# Patient Record
Sex: Female | Born: 1990 | Race: White | Hispanic: No | Marital: Single
Health system: Western US, Academic
[De-identification: ages and names within clinical notes are randomized; demographics above are authoritative.]

---
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
## 2010-08-14 ENCOUNTER — Ambulatory Visit (HOSPITAL_COMMUNITY)
Admission: RE | Admit: 2010-08-14 | Discharge: 2010-08-14 | Payer: Self-pay | Source: Home / Self Care | Attending: Internal Medicine | Admitting: Internal Medicine

## 2010-12-04 NOTE — Discharge Summary (Signed)
NAME:  Betty Robles, Betty Robles NO.:  [id number]   MEDICAL RECORD NO.:  [id number]          PATIENT TYPE:  INP   LOCATION:  6154                         FACILITY:  [facility]   PHYSICIAN:  Pediatrics Resident    DATE OF BIRTH:  1991/04/01   DATE OF ADMISSION:  11/16/2006  DATE OF DISCHARGE:                               DISCHARGE SUMMARY   REASON FOR HOSPITALIZATION:  Ethanol intoxication.   SIGNIFICANT FINDINGS:  The patient is a 20 year old who was found  unresponsive at 2:00 a.m. at home and brought to the ED by EMS.  Due to  the patient's unresponsiveness she was actually intubated out in the  field before she was brought into the ED.  She had a head CT here that  was negative and an ethanol level was drawn which was found to be 390  initially and therefore it was thought that her unresponsiveness was due  to ethanol intoxication.  Her ethanol levels decreased progressively  throughout the hospital stay down to 270 at the time of discharge.  She  awoke the morning of her hospitalization which was approximately 10  hours after admission and was extubated at that time.  She came off of 2  liters of oxygen very quickly and was maintaining her sats in the high  90s to 100% for greater than 4 hours at the time of discharge.  A  psychiatrist saw the patient who felt like she did have some elements of  depression; however, did not feel that this was a suicide attempt and  the patient was safe to go home.  The patient expressed interest in  seeing the psychiatrist as an outpatient.  The patient was tolerating  p.o. and was stable at the time of discharge.   OPERATIONS/PROCEDURES:  Intubated.  Chest x-ray shows no acute process.   FINAL DIAGNOSES:  1. Ethanol intoxication.  2. Possible depression.   DISCHARGE INSTRUCTIONS:  Please avoid all alcohol.  Please report to the PCP or the ER immediately if there is any change in  mental status or if she has any thoughts of hurting  herself or anyone  else.   FOLLOWUP:  The patient is to followup with Dr. Theresia Lo of [facility] on Nov 18, 2006, at 10:00 a.m. and to followup with Dr. Lindie Spruce of  psychology on Nov 22, 2006.   DISCHARGE CONDITION:  Fair.   This was faxed to primary physician, Dr. Theresia Lo on November 16, 2006.           ______________________________  Pediatrics Resident     PR/MEDQ  D:  11/16/2006  T:  11/16/2006  Job:  [id number]

## 2010-12-04 NOTE — Discharge Summary (Signed)
NAME:  Betty Robles, Betty Robles NO.:  [id number]   MEDICAL RECORD NO.:  [id number]          PATIENT TYPE:  INP   LOCATION:  6153                         FACILITY:  [facility]   PHYSICIAN:  Pediatrics Resident    DATE OF BIRTH:  1990-11-18   DATE OF ADMISSION:  11/15/2006  DATE OF DISCHARGE:  11/17/2006                               DISCHARGE SUMMARY   ADDENDUM:  The patient was kept overnight because of inability to take p.o. fluids  and was watched.  She had a repeat blood alcohol level checked in the  morning; it was found to be less than 5.  She was doing well clinically  and was then discharged from the floor to home with followup on Friday,  Nov 18, 2006.           ______________________________  Pediatrics Resident     PR/MEDQ  D:  11/17/2006  T:  11/17/2006  Job:  [id number]

## 2011-09-30 IMAGING — US US TRANSVAGINAL NON-OB
1 series · 13 of 25 positions shown · non-contrast
Comparison: None.

CLINICAL DATA: Pelvic pain for 1 month with spotting between
cycles.  LMP 07/20/2010



[Series 1: us pelvis complete modify · 13 of 36 slices shown]
[im 1/36]
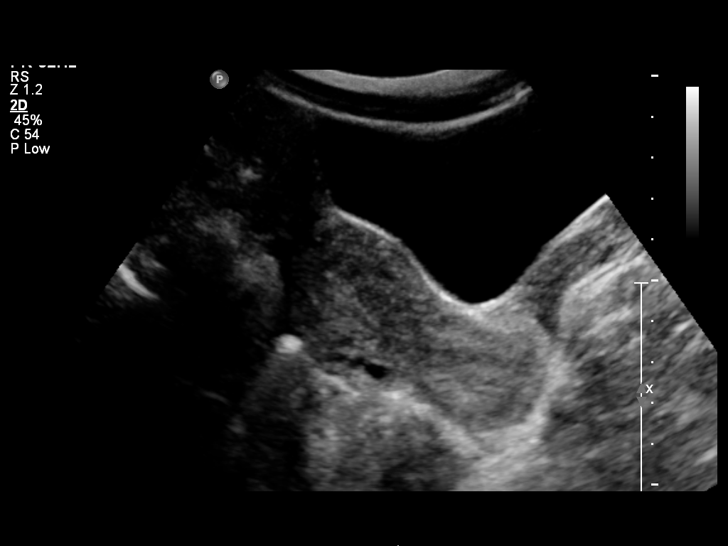
[im 3/36]
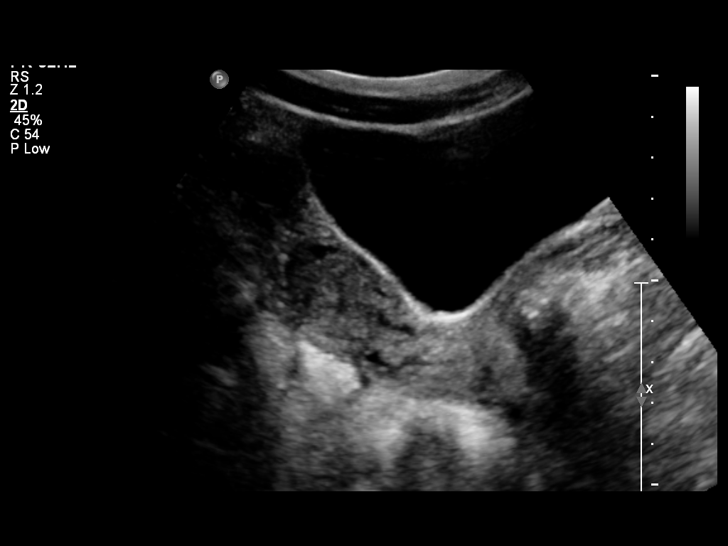
[im 6/36]
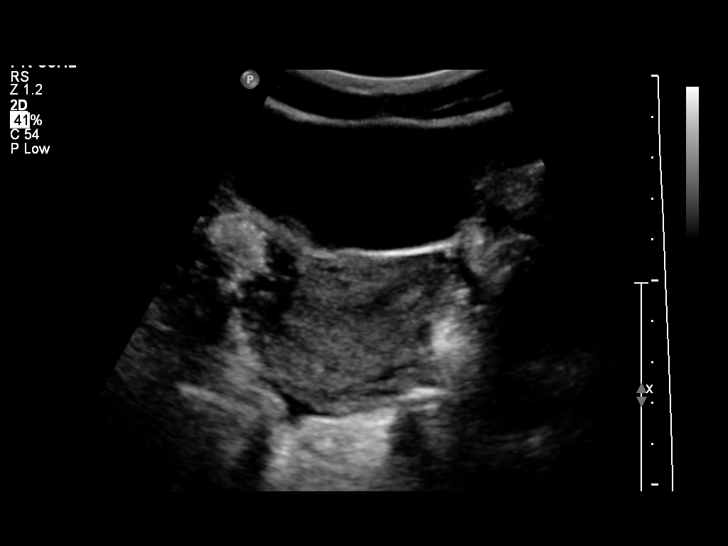
[im 9/36]
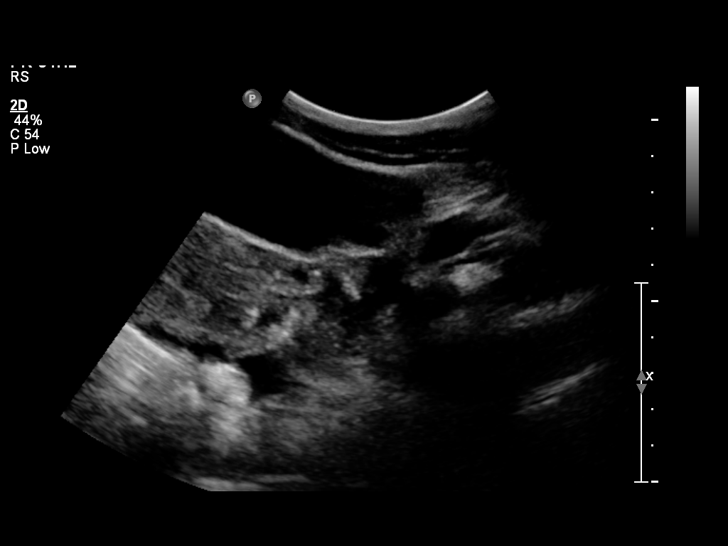
[im 12/36]
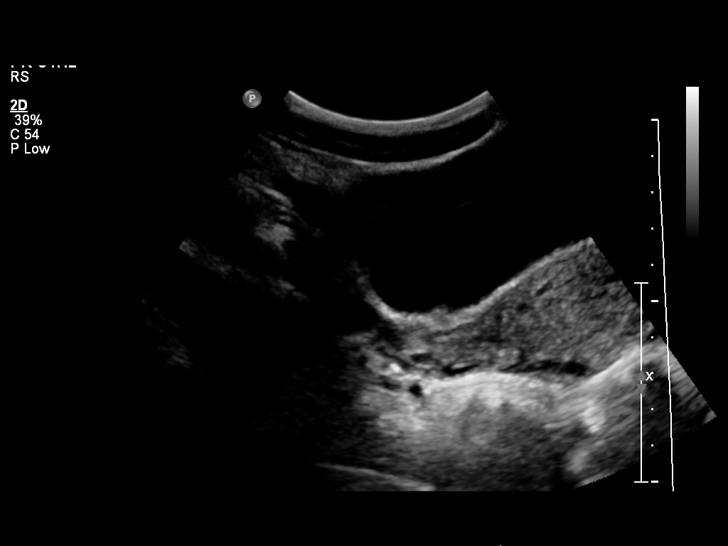
[im 15/36]
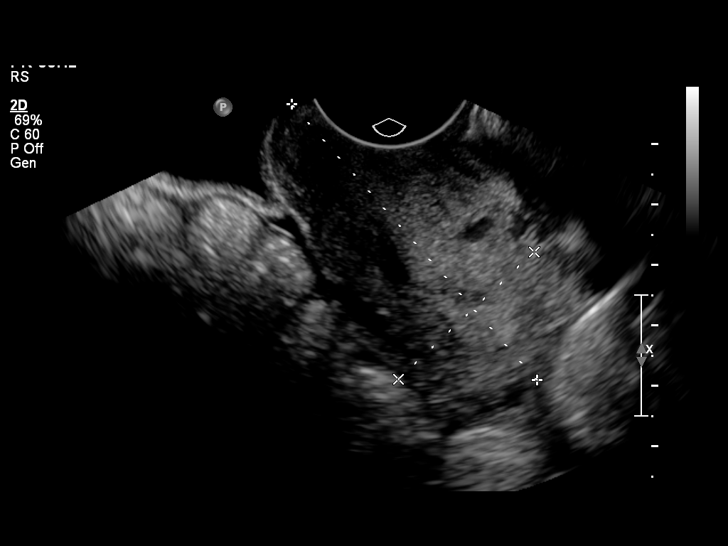
[im 18/36]
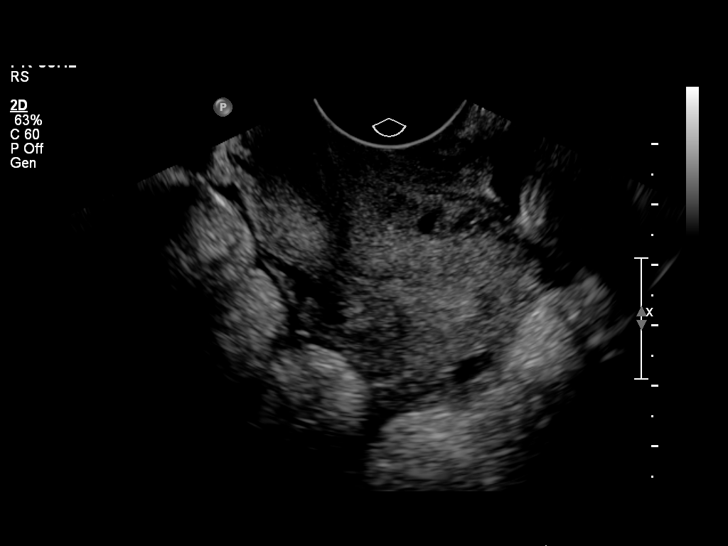
[im 21/36]
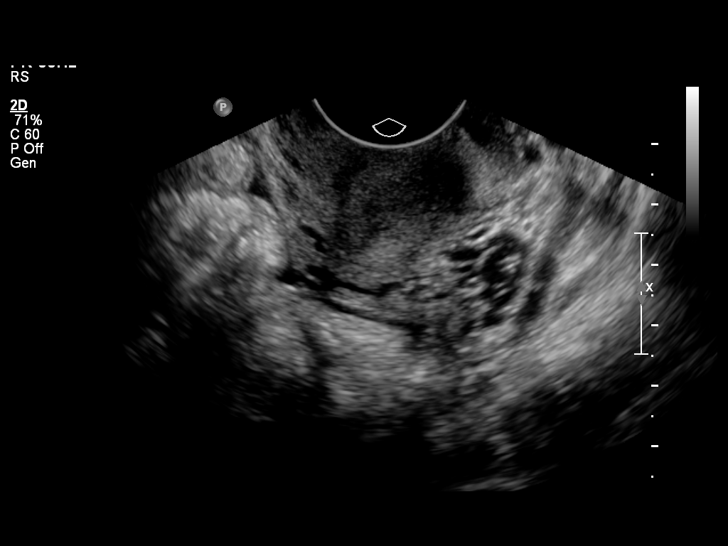
[im 24/36]
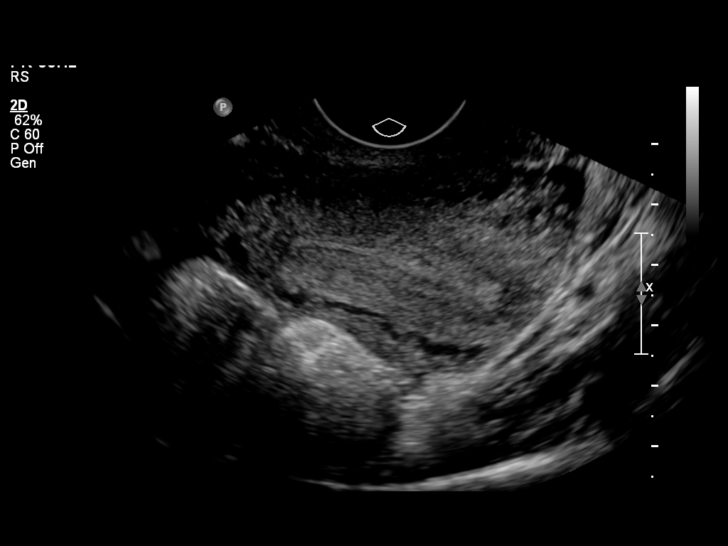
[im 27/36]
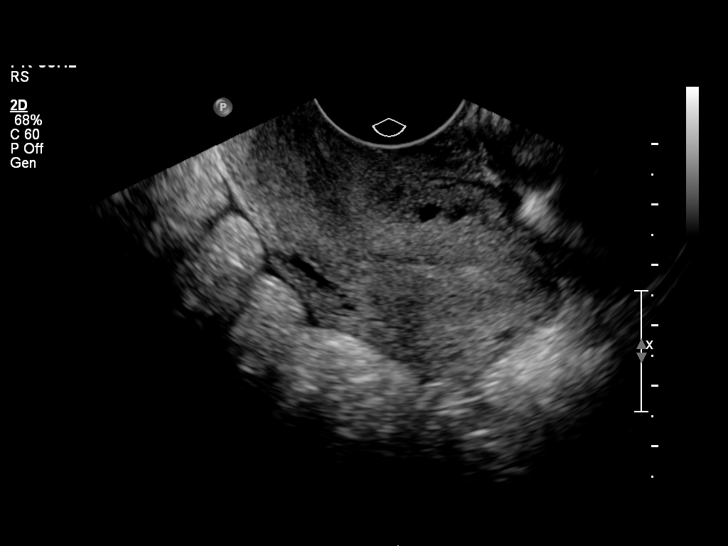
[im 30/36]
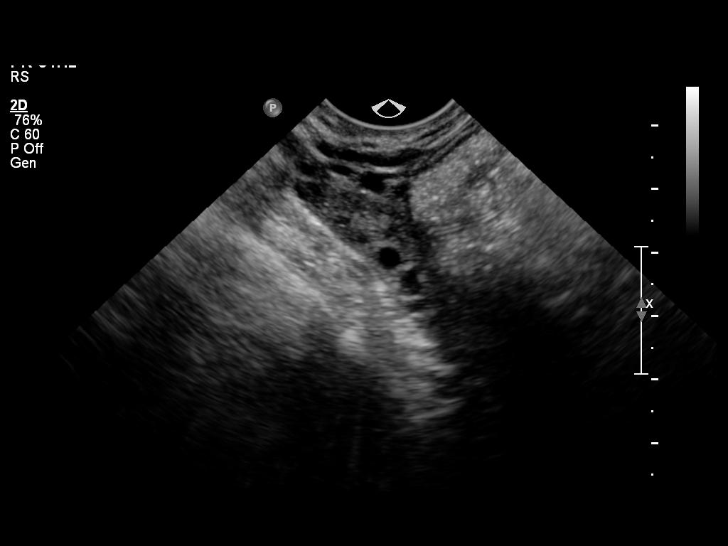
[im 33/36]
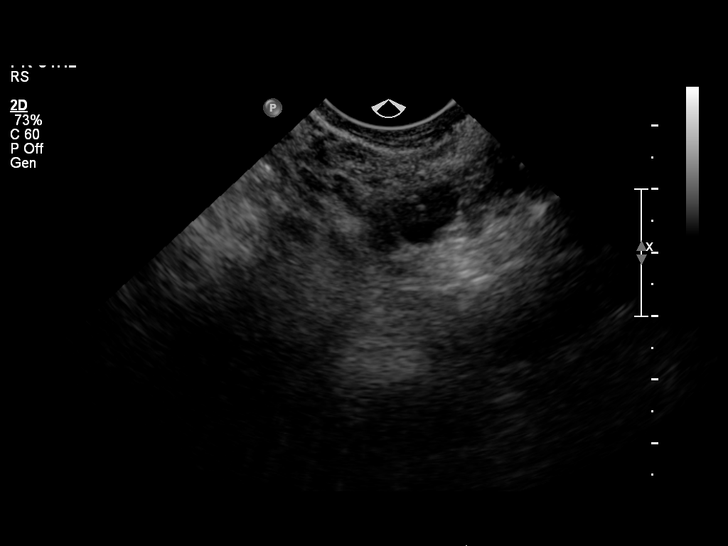
[im 36/36]
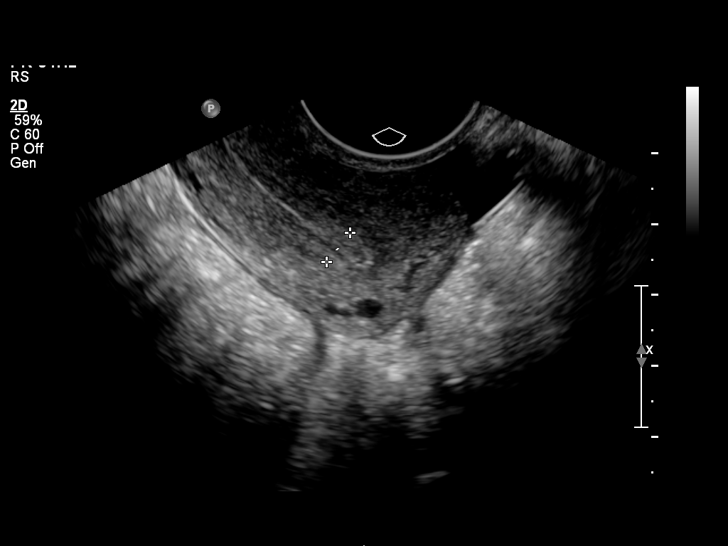

[13 of 25 positions shown; findings below may reference images not displayed]

FINDINGS: Uterus the uterus demonstrates a sagittal length of 6.1 cm, an AP
depth of 3.1 cm and a transverse width of 5.2 cm.  A homogeneous
uterine myometrium is seen.

Endometrium is homogeneously echogenic with an AP width of 5.5 mm.
No areas of focal thickening or heterogeneity are seen and this
would correlate with a presecretory endometrial stripe and
correspond with the patient's given LMP of 07/20/2010

Right Ovary has a normal appearance measuring 1.4 x 3.27 x 1.4 cm

Left Ovary is not seen with confidence either transabdominally or
endovaginally

Other Findings:  A large amount of peristalsing bowel was noted at
real time exam and shadowing from bowel gas is noted in the left
lower quadrant suggesting the possibility of stool within the
sigmoid colon.  A small  amount of simple free fluid is noted in
the cul-de-sac.  No separate adnexal masses are noted.
IMPRESSION: Normal presecretory uterine myometrium, endometrium and right
ovary.  Non-visualized left ovary.

The large amount of peristalsing bowel was present at real time
exam as well as the suggestion of stool within the sigmoid colon.

## 2011-10-11 ENCOUNTER — Other Ambulatory Visit: Payer: Self-pay | Admitting: Family Medicine

## 2011-10-11 MED ORDER — LEVONORGESTREL-ETHINYL ESTRAD 0.1-20 MG-MCG PO TABS: 1.0000 | ORAL_TABLET | Freq: Every day | ORAL | Status: DC

## 2011-11-03 ENCOUNTER — Encounter: Payer: Self-pay | Admitting: Internal Medicine

## 2011-11-03 ENCOUNTER — Ambulatory Visit (INDEPENDENT_AMBULATORY_CARE_PROVIDER_SITE_OTHER): Payer: BC Managed Care – PPO | Admitting: Internal Medicine

## 2011-11-03 VITALS — BP 135/93 | HR 81 | Temp 98.1°F | Resp 16 | Ht 66.5 in | Wt 130.6 lb

## 2011-11-03 DIAGNOSIS — R000 Tachycardia, unspecified: Secondary | ICD-10-CM

## 2011-11-03 DIAGNOSIS — Z309 Encounter for contraceptive management, unspecified: Secondary | ICD-10-CM

## 2011-11-03 MED ORDER — LEVONORGESTREL-ETHINYL ESTRAD 0.1-20 MG-MCG PO TABS: 1.0000 | ORAL_TABLET | Freq: Every day | ORAL | Status: DC

## 2011-11-04 ENCOUNTER — Encounter: Payer: Self-pay | Admitting: Internal Medicine

## 2011-11-04 NOTE — Progress Notes (Signed)
  Subjective:    Patient ID: Betty Robles, female    DOB: Apr 26, 1991, 20 y.o.   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  Active gym working out regularly/has one question about whether her heart rate is okay-at times she reaches 180 while on the treadmill or elliptical trainer/she has no associated symptoms of chest pain or shortness of breath that is excessive and no post performance problems with lightheadedness or syncope  Review of SystemsNoncontributory Including cardiovascular respiratory or neurological symptoms     Objective:   Physical ExamBlood pressure 130/80 repeated No thyromegaly Heart Regular without murmur rub click or gallop rate is 65  lungs clear Neurological intact        Assessment & Plan:  Contraceptive care Refill oral contraceptive x1 year Schedule Pap smear in one year  Tachycardia with exercise Borderline  blood pressures  with office visits by history

## 2012-10-09 ENCOUNTER — Other Ambulatory Visit: Payer: Self-pay | Admitting: Internal Medicine

## 2012-11-13 ENCOUNTER — Other Ambulatory Visit: Payer: Self-pay | Admitting: Internal Medicine

## 2012-11-22 ENCOUNTER — Ambulatory Visit (INDEPENDENT_AMBULATORY_CARE_PROVIDER_SITE_OTHER): Payer: BC Managed Care – PPO | Admitting: Internal Medicine

## 2012-11-22 ENCOUNTER — Encounter: Payer: Self-pay | Admitting: Internal Medicine

## 2012-11-22 VITALS — BP 116/70 | HR 74 | Temp 99.4°F | Resp 16 | Ht 67.0 in | Wt 129.6 lb

## 2012-11-22 DIAGNOSIS — Z23 Encounter for immunization: Secondary | ICD-10-CM

## 2012-11-22 DIAGNOSIS — Z0000 Encounter for general adult medical examination without abnormal findings: Secondary | ICD-10-CM

## 2012-11-22 MED ORDER — LEVONORGESTREL-ETHINYL ESTRAD 0.1-20 MG-MCG PO TABS: ORAL_TABLET | ORAL | Status: DC

## 2012-11-22 MED ORDER — TETANUS-DIPHTH-ACELL PERTUSSIS 5-2.5-18.5 LF-MCG/0.5 IM SUSP: 0.5000 mL | Freq: Once | INTRAMUSCULAR | Status: DC

## 2012-11-22 NOTE — Progress Notes (Signed)
  Subjective:    Patient ID: Betty Robles, female    DOB: 1990/07/29, 22 y.o.   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

## 2012-11-22 NOTE — Progress Notes (Addendum)
  Subjective:    Patient ID: Betty Robles, female    DOB: 1991/05/22, 22 y.o.   MRN: [id number]  HPI    Review of Systems  Constitutional: Negative.   HENT: Negative.   Eyes: Negative.   Respiratory: Negative.   Cardiovascular: Negative.   Gastrointestinal: Negative.   Endocrine: Negative.   Genitourinary: Negative.   Musculoskeletal: Negative.   Skin: Negative.   Allergic/Immunologic: Negative.   Neurological: Negative.   Hematological: Negative.   Psychiatric/Behavioral: Negative.        Objective:   Physical Exam        Assessment & Plan:  Rev RPD

## 2012-11-23 LAB — PAP IG, CT-NG, RFX HPV ASCU: Chlamydia Probe Amp: NEGATIVE

## 2012-11-27 ENCOUNTER — Encounter: Payer: Self-pay | Admitting: Internal Medicine

## 2013-11-28 ENCOUNTER — Ambulatory Visit (INDEPENDENT_AMBULATORY_CARE_PROVIDER_SITE_OTHER): Payer: BC Managed Care – PPO | Admitting: Physician Assistant

## 2013-11-28 VITALS — BP 122/82 | HR 78 | Temp 98.8°F | Resp 16 | Ht 67.0 in | Wt 128.0 lb

## 2013-11-28 DIAGNOSIS — Z309 Encounter for contraceptive management, unspecified: Secondary | ICD-10-CM

## 2013-11-28 DIAGNOSIS — Z113 Encounter for screening for infections with a predominantly sexual mode of transmission: Secondary | ICD-10-CM

## 2013-11-28 DIAGNOSIS — Z0000 Encounter for general adult medical examination without abnormal findings: Secondary | ICD-10-CM

## 2013-11-28 LAB — POCT CBC
Granulocyte percent: 62.4 %G (ref 37–80)
HEMATOCRIT: 42.1 % (ref 37.7–47.9)
Hemoglobin: 13.6 g/dL (ref 12.2–16.2)
LYMPH, POC: 2.6 (ref 0.6–3.4)
MCH: 30.4 pg (ref 27–31.2)
MCHC: 32.3 g/dL (ref 31.8–35.4)
MCV: 94.1 fL (ref 80–97)
MID (cbc): 0.5 (ref 0–0.9)
MPV: 9.1 fL (ref 0–99.8)
PLATELET COUNT, POC: 329 10*3/uL (ref 142–424)
POC Granulocyte: 5.2 (ref 2–6.9)
POC LYMPH %: 31.3 % (ref 10–50)
POC MID %: 6.3 %M (ref 0–12)
RBC: 4.47 M/uL (ref 4.04–5.48)
RDW, POC: 12.8 %
WBC: 8.4 10*3/uL (ref 4.6–10.2)

## 2013-11-28 MED ORDER — LEVONORGESTREL-ETHINYL ESTRAD 0.1-20 MG-MCG PO TABS: ORAL_TABLET | ORAL | Status: DC

## 2013-11-28 NOTE — Progress Notes (Signed)
Subjective:    Patient ID: Betty AmourElina Robles, female    DOB: 05/31/1991, 23 y.o.   MRN: [id number]  HPI   Betty Robles is a very pleasant 23 yr old female here for CPE.  Complaints:  Refill on OCP, STD screening LMP:  Thinks 11/11/13, regular every month Not currently sexually active; estimates condoms 60% of the time Contraception:  OCPs GYN:  Normal pap 2014 Dentist:  Not regularly - 2-3 yrs ago Eye doctor:  Glasses at home; last eye appt 1.5 yrs ago Imm:  Utd, including gardasil Diet:  Varied, does eat junk food but tries to moderate; mostly water and coffee to drink Exercise:  Not really Meds:  Only ocp Tobacco:  Former smoker Etoh:  3-5 drinks per week  Work:  Forensic psychologistBartender/server     Review of Systems  Constitutional: Negative.   HENT: Negative.   Eyes: Negative.   Respiratory: Negative.   Cardiovascular: Negative.   Gastrointestinal: Negative.   Genitourinary: Negative.   Musculoskeletal: Negative.   Skin: Negative.        Objective:   Physical Exam  Vitals reviewed. Constitutional: She is oriented to person, place, and time. She appears well-developed and well-nourished. No distress.  HENT:  Head: Normocephalic and atraumatic.  Right Ear: Tympanic membrane and ear canal normal.  Left Ear: Ear canal normal.  Mouth/Throat: Uvula is midline, oropharynx is clear and moist and mucous membranes are normal.  Eyes: Conjunctivae and EOM are normal. Pupils are equal, round, and reactive to light. No scleral icterus.  Neck: Normal range of motion. Neck supple. No thyromegaly present.  Cardiovascular: Normal rate, regular rhythm, normal heart sounds and intact distal pulses.   Pulmonary/Chest: Effort normal and breath sounds normal. She has no wheezes. She has no rales.  Abdominal: Soft. Bowel sounds are normal. There is no tenderness.  Genitourinary:  Deferred as no complaints and not due for pap  Lymphadenopathy:    She has no cervical adenopathy.  Neurological:  She is alert and oriented to person, place, and time. She has normal reflexes.  Skin: Skin is warm and dry.  Psychiatric: She has a normal mood and affect. Her behavior is normal.   Self collected gen probe   Results for orders placed in visit on 11/28/13  COMPREHENSIVE METABOLIC PANEL      Result Value Ref Range   Sodium 136  135 - 145 mEq/L   Potassium 4.9  3.5 - 5.3 mEq/L   Chloride 103  96 - 112 mEq/L   CO2 25  19 - 32 mEq/L   Glucose, Bld 89  70 - 99 mg/dL   BUN 12  6 - 23 mg/dL   Creat 4.130.77  2.440.50 - 0.101.10 mg/dL   Total Bilirubin 0.4  0.2 - 1.2 mg/dL   Alkaline Phosphatase 46  39 - 117 U/L   AST 14  0 - 37 U/L   ALT 14  0 - 35 U/L   Total Protein 8.0  6.0 - 8.3 g/dL   Albumin 4.9  3.5 - 5.2 g/dL   Calcium 9.6  8.4 - 27.210.5 mg/dL  HIV ANTIBODY (ROUTINE TESTING)      Result Value Ref Range   HIV 1&2 Ab, 4th Generation NONREACTIVE  NONREACTIVE  TSH      Result Value Ref Range   TSH 1.159  0.350 - 4.500 uIU/mL  RPR      Result Value Ref Range   RPR NON REAC  NON REAC  LIPID PANEL  Result Value Ref Range   Cholesterol 198  0 - 200 mg/dL   Triglycerides 161153 (*) <150 mg/dL   HDL 58  >09>39 mg/dL   Total CHOL/HDL Ratio 3.4     VLDL 31  0 - 40 mg/dL   LDL Cholesterol 604109 (*) 0 - 99 mg/dL  POCT CBC      Result Value Ref Range   WBC 8.4  4.6 - 10.2 K/uL   Lymph, poc 2.6  0.6 - 3.4   POC LYMPH PERCENT 31.3  10 - 50 %L   MID (cbc) 0.5  0 - 0.9   POC MID % 6.3  0 - 12 %M   POC Granulocyte 5.2  2 - 6.9   Granulocyte percent 62.4  37 - 80 %G   RBC 4.47  4.04 - 5.48 M/uL   Hemoglobin 13.6  12.2 - 16.2 g/dL   HCT, POC 54.042.1  98.137.7 - 47.9 %   MCV 94.1  80 - 97 fL   MCH, POC 30.4  27 - 31.2 pg   MCHC 32.3  31.8 - 35.4 g/dL   RDW, POC 19.112.8     Platelet Count, POC 329  142 - 424 K/uL   MPV 9.1  0 - 99.8 fL        Assessment & Plan:  Routine general medical examination at a health care facility - Plan: POCT CBC, Comprehensive metabolic panel, HIV antibody, TSH, RPR,  GC/Chlamydia Probe Amp, Lipid panel  Screen for STD (sexually transmitted disease) - Plan: HIV antibody, RPR, GC/Chlamydia Probe Amp  Contraception management - Plan: levonorgestrel-ethinyl estradiol (ORSYTHIA) 0.1-20 MG-MCG tablet   Betty Robles is a very pleasant 23 yr old female here for CPE.  She appears to be in good health and exam is normal.  Routine labs drawn - all WNL.  GC/chlamydia, HIV, RPR all negative.  Utd on imm.  OCP refilled.  Due for pap in 2017    E. Frances FurbishElizabeth Seva Robles MHS, PA-C [facility] 5/14/20156:52 PM

## 2013-11-28 NOTE — Patient Instructions (Signed)

## 2013-11-29 LAB — COMPREHENSIVE METABOLIC PANEL
ALBUMIN: 4.9 g/dL (ref 3.5–5.2)
ALK PHOS: 46 U/L (ref 39–117)
ALT: 14 U/L (ref 0–35)
AST: 14 U/L (ref 0–37)
BILIRUBIN TOTAL: 0.4 mg/dL (ref 0.2–1.2)
BUN: 12 mg/dL (ref 6–23)
CHLORIDE: 103 meq/L (ref 96–112)
CO2: 25 mEq/L (ref 19–32)
Calcium: 9.6 mg/dL (ref 8.4–10.5)
Creat: 0.77 mg/dL (ref 0.50–1.10)
Glucose, Bld: 89 mg/dL (ref 70–99)
POTASSIUM: 4.9 meq/L (ref 3.5–5.3)
SODIUM: 136 meq/L (ref 135–145)
Total Protein: 8 g/dL (ref 6.0–8.3)

## 2013-11-29 LAB — LIPID PANEL
Cholesterol: 198 mg/dL (ref 0–200)
HDL: 58 mg/dL (ref >39)
LDL Cholesterol: 109 mg/dL — ABNORMAL HIGH (ref 0–99)
Total CHOL/HDL Ratio: 3.4 Ratio
Triglycerides: 153 mg/dL — ABNORMAL HIGH (ref <150)
VLDL: 31 mg/dL (ref 0–40)

## 2013-11-29 LAB — TSH: TSH: 1.159 u[IU]/mL (ref 0.350–4.500)

## 2013-11-29 LAB — RPR

## 2013-11-29 LAB — HIV ANTIBODY (ROUTINE TESTING W REFLEX): HIV 1&2 Ab, 4th Generation: NONREACTIVE

## 2013-11-30 LAB — GC/CHLAMYDIA PROBE AMP
CT PROBE, AMP APTIMA: NEGATIVE
GC PROBE AMP APTIMA: NEGATIVE

## 2013-12-07 ENCOUNTER — Other Ambulatory Visit: Payer: Self-pay | Admitting: Internal Medicine

## 2013-12-21 ENCOUNTER — Encounter: Payer: BC Managed Care – PPO | Admitting: Family Medicine

## 2014-10-13 ENCOUNTER — Ambulatory Visit (INDEPENDENT_AMBULATORY_CARE_PROVIDER_SITE_OTHER): Payer: BLUE CROSS/BLUE SHIELD | Admitting: Physician Assistant

## 2014-10-13 VITALS — BP 122/74 | HR 97 | Temp 98.7°F | Resp 16 | Ht 67.0 in | Wt 130.4 lb

## 2014-10-13 DIAGNOSIS — R058 Other specified cough: Secondary | ICD-10-CM

## 2014-10-13 DIAGNOSIS — R05 Cough: Secondary | ICD-10-CM

## 2014-10-13 MED ORDER — HYDROCOD POLST-CHLORPHEN POLST 10-8 MG/5ML PO LQCR: 5.0000 mL | Freq: Two times a day (BID) | ORAL | Status: DC | PRN

## 2014-10-13 NOTE — Progress Notes (Signed)
   Subjective:    Patient ID: Betty Robles, female    DOB: 04/01/1991, 24 y.o.   MRN: [id number]  HPI Pt presents to clinic with dry cough for the last 4 days.  About 10 days ago she got a cold with congestion and cough that was productive but that has all resolved and now she has this cough that seems to be coming from her throat - increased cough with deep breaths, talking and laying down.  She has tried OTC meds but none are helping.  She is otherwise healthy and only on OCPs.  OTC meds - delsym  Review of Systems  Constitutional: Negative for fever and chills.  HENT: Negative for congestion, postnasal drip, rhinorrhea and sore throat.   Respiratory: Positive for cough (dry).   Gastrointestinal: Negative.   Neurological: Negative for headaches.  Psychiatric/Behavioral: Positive for sleep disturbance (2nd to cough).       Objective:   Physical Exam  Constitutional: She is oriented to person, place, and time. She appears well-developed and well-nourished.  BP 122/74 mmHg  Pulse 97  Temp(Src) 98.7 F (37.1 C) (Oral)  Resp 16  Ht 5\' 7"  (1.702 m)  Wt 130 lb 6.4 oz (59.149 kg)  BMI 20.42 kg/m2  SpO2 98%  LMP 10/13/2014   HENT:  Head: Normocephalic and atraumatic.  Right Ear: External ear normal.  Left Ear: External ear normal.  Eyes: Conjunctivae are normal.  Neck: Normal range of motion.  Cardiovascular: Normal rate, regular rhythm and normal heart sounds.   No murmur heard. Pulmonary/Chest: Effort normal and breath sounds normal. She has no wheezes.  Lymphadenopathy:    She has no cervical adenopathy.  Neurological: She is alert and oriented to person, place, and time.  Skin: Skin is warm and dry.  Psychiatric: She has a normal mood and affect. Her behavior is normal. Judgment and thought content normal.       Assessment & Plan:  Post-viral cough syndrome - Plan: chlorpheniramine-HYDROcodone (TUSSIONEX PENNKINETIC ER) 10-8 MG/5ML LQCR   Symptomatic  care.  Benny LennertSarah Weber PA-C  [facility] 10/13/2014 8:52 AM

## 2014-10-18 ENCOUNTER — Telehealth: Payer: Self-pay

## 2014-10-18 DIAGNOSIS — R05 Cough: Secondary | ICD-10-CM

## 2014-10-18 DIAGNOSIS — R058 Other specified cough: Secondary | ICD-10-CM

## 2014-10-18 NOTE — Telephone Encounter (Signed)
Pt was prescribed tussionex on 10/13/14 and was told to call back if the symptoms persisted. She is requesting a refill of the rx because her cough came back after a day at work where she had to talk loudly.

## 2014-10-21 MED ORDER — HYDROCOD POLST-CHLORPHEN POLST 10-8 MG/5ML PO LQCR: 5.0000 mL | Freq: Two times a day (BID) | ORAL | Status: AC | PRN

## 2014-10-21 NOTE — Telephone Encounter (Signed)
Assessment & Plan:  Post-viral cough syndrome - Plan: chlorpheniramine-HYDROcodone (TUSSIONEX PENNKINETIC ER) 10-8 MG/5ML LQCR   Symptomatic care.  Benny LennertSarah Weber PA-C  [facility] 10/13/2014 8:52 AM

## 2014-10-21 NOTE — Telephone Encounter (Signed)
We have one [facility] of this [facility]. She will need to come in to be evaluated for any further refills.

## 2014-10-21 NOTE — Telephone Encounter (Signed)
lmom that rx is ready for p/u.

## 2014-11-29 ENCOUNTER — Other Ambulatory Visit: Payer: Self-pay

## 2014-11-29 DIAGNOSIS — Z308 Encounter for other contraceptive management: Secondary | ICD-10-CM

## 2014-11-29 MED ORDER — LEVONORGESTREL-ETHINYL ESTRAD 0.1-20 MG-MCG PO TABS: ORAL_TABLET | ORAL | Status: DC

## 2014-12-11 ENCOUNTER — Other Ambulatory Visit: Payer: Self-pay | Admitting: Physician Assistant

## 2015-01-31 ENCOUNTER — Other Ambulatory Visit: Payer: Self-pay | Admitting: Physician Assistant

## 2015-02-03 NOTE — Telephone Encounter (Signed)
Pt hasn't been seen for contraception management in over a year. LMOM to CB w/f/up plan.

## 2020-09-04 ENCOUNTER — Inpatient Hospital Stay
Admission: EM | Admit: 2020-09-04 | Discharge: 2020-09-05 | DRG: 897 | Disposition: A | Discharge status: Home / Self Care | Payer: 59 | Attending: Internal Medicine | Admitting: Internal Medicine

## 2020-09-04 ENCOUNTER — Emergency Department: Payer: 59

## 2020-09-04 DIAGNOSIS — F10139 Alcohol abuse with withdrawal, unspecified (CMS-HCC): Secondary | ICD-10-CM | POA: Diagnosis present

## 2020-09-04 DIAGNOSIS — Z789 Other specified health status: Secondary | ICD-10-CM

## 2020-09-04 DIAGNOSIS — F10939 Alcohol use, unspecified with withdrawal, unspecified (CMS-HCC): Secondary | ICD-10-CM

## 2020-09-04 DIAGNOSIS — F10239 Alcohol dependence with withdrawal, unspecified: Secondary | ICD-10-CM

## 2020-09-04 DIAGNOSIS — G9340 Encephalopathy, unspecified: Secondary | ICD-10-CM | POA: Diagnosis present

## 2020-09-04 DIAGNOSIS — N390 Urinary tract infection, site not specified: Secondary | ICD-10-CM

## 2020-09-04 DIAGNOSIS — Z20822 Contact with and (suspected) exposure to covid-19: Secondary | ICD-10-CM | POA: Diagnosis present

## 2020-09-04 DIAGNOSIS — R9431 Abnormal electrocardiogram [ECG] [EKG]: Secondary | ICD-10-CM

## 2020-09-04 DIAGNOSIS — R8271 Bacteriuria: Secondary | ICD-10-CM | POA: Diagnosis present

## 2020-09-04 DIAGNOSIS — R269 Unspecified abnormalities of gait and mobility: Secondary | ICD-10-CM

## 2020-09-04 DIAGNOSIS — F17200 Nicotine dependence, unspecified, uncomplicated: Secondary | ICD-10-CM | POA: Diagnosis present

## 2020-09-04 DIAGNOSIS — F10151 Alcohol abuse with alcohol-induced psychotic disorder with hallucinations: Principal | ICD-10-CM | POA: Diagnosis present

## 2020-09-04 DIAGNOSIS — R4182 Altered mental status, unspecified: Secondary | ICD-10-CM

## 2020-09-04 LAB — COMPREHENSIVE METABOLIC PANEL, BLOOD
ALT: 27 U/L (ref 7–52)
AST: 50 U/L — ABNORMAL HIGH (ref 13–39)
Albumin: 4.1 G/DL (ref 3.7–5.3)
Alk Phos: 80 U/L (ref 34–104)
BUN: 11 mg/dL (ref 7–25)
Bilirubin, Total: 1.3 mg/dL (ref 0.0–1.4)
CO2: 26 mmol/L (ref 21–31)
Calcium: 8.5 mg/dL — ABNORMAL LOW (ref 8.6–10.3)
Chloride: 98 mmol/L (ref 98–107)
Creat: 0.5 mg/dL — ABNORMAL LOW (ref 0.6–1.2)
Electrolyte Balance: 9 mmol/L (ref 2–12)
Glucose: 101 mg/dL (ref 70–115)
Potassium: 3.6 mmol/L (ref 3.5–5.1)
Protein, Total: 7.2 G/DL (ref 6.0–8.3)
Sodium: 133 mmol/L — ABNORMAL LOW (ref 136–145)
eGFR - high estimate: >60 (ref >59)
eGFR - low estimate: >60 (ref >59)

## 2020-09-04 LAB — HCG QUANTITATIVE, BLOOD: Beta hCG: <1 m[IU]/mL

## 2020-09-04 LAB — VENOUS BLOOD GAS AND LYTES
Base Excess: 2.6 mmol/L
Calcium, Ionized: 1.09 mmol/L — ABNORMAL LOW (ref 1.13–1.32)
Carboxy Hemoglobin: 1.1 % (ref 0.5–1.5)
Chloride (with BG): 97 mmol/L — ABNORMAL LOW (ref 99–111)
Glucose (with BG): 110 MG/DL (ref 70–110)
HCO3: 27.6 mmol/L — ABNORMAL HIGH (ref 21.0–27.0)
Hematocrit (with BG): 41 % (ref 38–46)
Inspired O2: 0.21
Methemoglobin: 0.3 % (ref 0.0–1.5)
O2 Content: 10.8 ML/DL — ABNORMAL LOW (ref 16.0–21.5)
O2 Saturation: 58.5 %
PCO2, Venous: 44.2 MMHG (ref 42.0–48.0)
PO2, Venous: 29.8 MMHG — ABNORMAL LOW (ref 30.0–49.0)
Potassium, (with BG): 3.8 mmol/L (ref 3.7–5.5)
Sodium (with BG): 136 mmol/L — ABNORMAL LOW (ref 138–146)
Total CO2: 29 mmol/L (ref 23–29)
pH, Venous: 7.41 — ABNORMAL HIGH (ref 7.36–7.40)

## 2020-09-04 LAB — SALICYLATES, SERUM: Salicylates, Bld: <1 MG/DL — ABNORMAL LOW (ref 6–30)

## 2020-09-04 LAB — CBC WITH DIFF, BLOOD
ANC automated: 5.9 10*3/uL (ref 2.0–8.1)
Basophils %: 0.4 %
Basophils Absolute: 0 10*3/uL (ref 0.0–0.2)
Eosinophils %: 1.5 %
Eosinophils Absolute: 0.1 10*3/uL (ref 0.0–0.5)
Hematocrit: 36.3 % (ref 34.0–44.0)
Hgb: 12.5 G/DL (ref 11.5–15.0)
Lymphocytes %: 25.3 %
Lymphocytes Absolute: 2.3 10*3/uL (ref 0.9–3.3)
MCH: 32.4 PG (ref 27.0–33.5)
MCHC: 34.3 G/DL (ref 32.0–35.5)
MCV: 94.3 FL (ref 81.5–97.0)
MPV: 8.4 FL (ref 7.2–11.7)
Monocytes %: 8.4 %
Monocytes Absolute: 0.8 10*3/uL (ref 0.0–0.8)
Neutrophils % (A): 64.4 %
PLT Count: 120 10*3/uL — ABNORMAL LOW (ref 150–400)
RBC: 3.85 10*6/uL (ref 3.70–5.00)
RDW-CV: 14.6 % — ABNORMAL HIGH (ref 11.6–14.4)
White Bld Cell Count: 9.1 10*3/uL (ref 4.0–10.5)

## 2020-09-04 LAB — LACTATE, BLOOD: Lactic Acid: 1.3 mmol/L (ref 0.5–2.0)

## 2020-09-04 LAB — COVID-19, FLU A/B PANEL (POC)
COVID-19 Result: NOT DETECTED
Influenza A, PCR: NOT DETECTED
Influenza B, PCR: NOT DETECTED
Respiratory Virus Comment: NOT DETECTED

## 2020-09-04 LAB — ACETAMINOPHEN, BLOOD: Acetaminophen, Bld: <10 ug/mL — ABNORMAL LOW (ref 10.0–30.0)

## 2020-09-04 LAB — URINALYSIS
Bilirubin, UA: NEGATIVE
Glucose, UA: NEGATIVE MG/DL
Ketones, UA: NEGATIVE MG/DL
Nitrite, UA: NEGATIVE
Protein, UA: 100 MG/DL — AB
RBC, UA: 7 #/HPF — ABNORMAL HIGH (ref 0–3)
Specific Grav, UA: 1.018 (ref 1.003–1.030)
Squamous Epithelial, UA: 1 /HPF (ref 0–10)
Urobilinogen, UA: 2 MG/DL — ABNORMAL HIGH (ref <2.0)
WBC, UA: 143 #/HPF — ABNORMAL HIGH (ref 0–5)
pH, UA: 8 (ref 5.0–8.0)

## 2020-09-04 LAB — DRUG SCREEN RAPID PANEL 10 NO CONFIRMATION, URINE
Amphetamines, Urine: NOT DETECTED
Barbiturates: NOT DETECTED
Benzodiazepines: POSITIVE — AB
Cocaine: NOT DETECTED
MDMA: NOT DETECTED
Methadone: NOT DETECTED
Opiates: NOT DETECTED
PCP: NOT DETECTED
Propoxyphene: NOT DETECTED
THC: NOT DETECTED

## 2020-09-04 LAB — ECG 12-LEAD
PR INTERVAL: 116 ms
QT: 479 ms
R AXIS: 74 Deg
R-R INTERVAL AVERAGE: 864 ms
T AXIS: 75 Deg
VENTRICULAR RATE: 69 {beats}/min

## 2020-09-04 LAB — ALCOHOL, ETHYL: Alcohol, Ethyl: NOT DETECTED mg/dL

## 2020-09-04 LAB — PT/INR/PTT
INR: 0.98 (ref 0.90–1.10)
PTT: 22.5 s — ABNORMAL LOW (ref 24.3–34.9)
Prothrombin Time: 12.6 s (ref 11.8–13.8)

## 2020-09-04 LAB — THYROID CASCADE: TSH, Ultrasensitive: 3.412 u[IU]/mL (ref 0.450–4.120)

## 2020-09-04 LAB — AMMONIA, PLASMA: Ammonia: 42 MCMOL/L (ref 16–53)

## 2020-09-04 LAB — BETA-HYDROXYBUTYRATE: Beta Hydroxybutyrate: 0.42 mmol/L — ABNORMAL HIGH (ref 0.02–0.27)

## 2020-09-04 LAB — GLUCOSE, POINT OF CARE: Glucose, Point of Care: 141 MG/DL — ABNORMAL HIGH (ref 70–125)

## 2020-09-04 LAB — TROPONIN I, HIGH SENSITIVITY: Troponin I, High Sensitivity: 3 ng/L (ref 0–15)

## 2020-09-04 LAB — MAGNESIUM, BLOOD: Magnesium: 2 mg/dL (ref 1.9–2.7)

## 2020-09-04 LAB — PHOSPHORUS, BLOOD: Phosphorus: 3.7 MG/DL (ref 2.5–5.0)

## 2020-09-04 MED ORDER — ENOXAPARIN SODIUM 40 MG/0.4ML SC SOLN
40.0000 mg | Freq: Every day | SUBCUTANEOUS | Status: DC
Start: 2020-09-04 — End: 2020-09-05
  Administered 2020-09-04 – 2020-09-05 (×2): 40 mg via SUBCUTANEOUS
  Filled 2020-09-04 (×2): qty 1

## 2020-09-04 MED ORDER — DEXTROSE-NACL 5-0.9 % IV SOLN (CUSTOM)
INTRAVENOUS | Status: DC
Start: 2020-09-04 — End: 2020-09-05

## 2020-09-04 MED ORDER — TAB-A-VITE/BETA CAROTENE PO TABS
1.0000 | ORAL_TABLET | Freq: Every day | ORAL | Status: DC
Start: 2020-09-04 — End: 2020-09-05
  Administered 2020-09-04 – 2020-09-05 (×2): 1 via ORAL
  Filled 2020-09-04 (×2): qty 1

## 2020-09-04 MED ORDER — HYDROCODONE-ACETAMINOPHEN 5-325 MG OR TABS
1.0000 | ORAL_TABLET | ORAL | Status: DC | PRN
Start: 2020-09-04 — End: 2020-09-05

## 2020-09-04 MED ORDER — CARBOXYMETHYLCELLULOSE SODIUM 1 % OP SOLN
1.0000 [drp] | OPHTHALMIC | Status: DC | PRN
Start: 2020-09-04 — End: 2020-09-05
  Filled 2020-09-04: qty 15

## 2020-09-04 MED ORDER — SENNA 8.6 MG OR TABS
8.6000 mg | ORAL_TABLET | Freq: Every evening | ORAL | Status: DC
Start: 2020-09-04 — End: 2020-09-05

## 2020-09-04 MED ORDER — DEXTROSE 10 % IV SOLN
50.0000 mL/h | INTRAVENOUS | Status: DC | PRN
Start: 2020-09-04 — End: 2020-09-05

## 2020-09-04 MED ORDER — DEXTROSE 50 % IV SOLN
25.0000 mL | INTRAVENOUS | Status: DC | PRN
Start: 2020-09-04 — End: 2020-09-05

## 2020-09-04 MED ORDER — GLUCAGON HCL (DIAGNOSTIC) 1 MG IJ SOLR
1.0000 mg | INTRAMUSCULAR | Status: DC | PRN
Start: 2020-09-04 — End: 2020-09-05

## 2020-09-04 MED ORDER — SODIUM CHLORIDE 0.9 % IV SOLN
1000.0000 mg | Freq: Once | INTRAVENOUS | Status: AC
Start: 2020-09-04 — End: 2020-09-04
  Administered 2020-09-04 (×2): 1000 mg via INTRAVENOUS
  Filled 2020-09-04: qty 1000

## 2020-09-04 MED ORDER — LORAZEPAM 2 MG/ML IJ SOLN
4.0000 mg | INTRAMUSCULAR | Status: DC | PRN
Start: 2020-09-04 — End: 2020-09-05

## 2020-09-04 MED ORDER — PHENOBARBITAL SODIUM 130 MG/ML IJ SOLN
260.0000 mg | Freq: Once | INTRAMUSCULAR | Status: AC
Start: 2020-09-04 — End: 2020-09-04
  Administered 2020-09-04 (×2): 260 mg via INTRAVENOUS
  Filled 2020-09-04: qty 2

## 2020-09-04 MED ORDER — TRIMETHOBENZAMIDE HCL 100 MG/ML IM SOLN
200.0000 mg | Freq: Three times a day (TID) | INTRAMUSCULAR | Status: DC | PRN
Start: 2020-09-04 — End: 2020-09-05
  Filled 2020-09-04 (×2): qty 2

## 2020-09-04 MED ORDER — DIAZEPAM 5 MG/ML IJ SOLN
5.0000 mg | Freq: Once | INTRAMUSCULAR | Status: AC
Start: 2020-09-04 — End: 2020-09-04
  Administered 2020-09-04: 5 mg via INTRAVENOUS
  Filled 2020-09-04: qty 2

## 2020-09-04 MED ORDER — DEXTROSE 50 % IV SOLN
50.0000 mL | INTRAVENOUS | Status: DC | PRN
Start: 2020-09-04 — End: 2020-09-05

## 2020-09-04 MED ORDER — ACETAMINOPHEN 325 MG PO TABS
650.0000 mg | ORAL_TABLET | ORAL | Status: DC | PRN
Start: 2020-09-04 — End: 2020-09-05
  Administered 2020-09-05: 650 mg via ORAL
  Filled 2020-09-04: qty 2

## 2020-09-04 MED ORDER — SODIUM CHLORIDE 0.9 % IV SOLN
12.5000 mg | Freq: Four times a day (QID) | INTRAVENOUS | Status: DC | PRN
Start: 2020-09-04 — End: 2020-09-04

## 2020-09-04 MED ORDER — DEXTROSE 50 % IV SOLN
12.5000 mL | INTRAVENOUS | Status: DC | PRN
Start: 2020-09-04 — End: 2020-09-05

## 2020-09-04 MED ORDER — FOLIC ACID 1 MG OR TABS
1.0000 mg | ORAL_TABLET | Freq: Every day | ORAL | Status: DC
Start: 2020-09-04 — End: 2020-09-05
  Administered 2020-09-04 – 2020-09-05 (×3): 1 mg via ORAL
  Filled 2020-09-04 (×2): qty 1

## 2020-09-04 MED ORDER — ACETAMINOPHEN 650 MG RE SUPP
650.0000 mg | RECTAL | Status: DC | PRN
Start: 2020-09-04 — End: 2020-09-05

## 2020-09-04 MED ORDER — DIAZEPAM 5 MG/ML IJ SOLN
10.0000 mg | Freq: Once | INTRAMUSCULAR | Status: AC
Start: 2020-09-04 — End: 2020-09-04
  Administered 2020-09-04 (×2): 10 mg via INTRAVENOUS
  Filled 2020-09-04: qty 2

## 2020-09-04 MED ORDER — POLYETHYLENE GLYCOL 3350 OR PACK
17.0000 g | PACK | Freq: Every day | ORAL | Status: DC
Start: 2020-09-04 — End: 2020-09-05
  Administered 2020-09-04 (×2): 17 g via ORAL
  Filled 2020-09-04 (×2): qty 1

## 2020-09-04 MED ORDER — MAGNESIUM SULFATE 2 GM/50ML IV SOLN
2.0000 g | Freq: Once | INTRAVENOUS | Status: AC
Start: 2020-09-04 — End: 2020-09-04
  Administered 2020-09-04 (×2): 2 g via INTRAVENOUS
  Filled 2020-09-04: qty 50

## 2020-09-04 MED ORDER — SODIUM CHLORIDE 0.9 % IV SOLN
12.5000 mg | Freq: Four times a day (QID) | INTRAVENOUS | Status: DC | PRN
Start: 2020-09-04 — End: 2020-09-05

## 2020-09-04 MED ORDER — ONDANSETRON HCL 4 MG/2ML IV SOLN
4.00 mg | INTRAMUSCULAR | Status: DC
Start: ? — End: 2020-09-04

## 2020-09-04 MED ORDER — THIAMINE HCL 100 MG OR TABS (CUSTOM)
100.0000 mg | ORAL_TABLET | Freq: Every day | ORAL | Status: DC
Start: 2020-09-04 — End: 2020-09-05
  Administered 2020-09-04 – 2020-09-05 (×2): 100 mg via ORAL
  Filled 2020-09-04 (×2): qty 1

## 2020-09-04 MED ORDER — LORAZEPAM 2 MG/ML IJ SOLN
2.0000 mg | INTRAMUSCULAR | Status: DC | PRN
Start: 2020-09-04 — End: 2020-09-05

## 2020-09-04 MED ORDER — SODIUM CHLORIDE 0.9 % IV BOLUS (~~LOC~~)
1000.0000 mL | INJECTION | Freq: Once | INTRAVENOUS | Status: AC
Start: 2020-09-04 — End: 2020-09-04
  Administered 2020-09-04 (×2): 1000 mL via INTRAVENOUS

## 2020-09-04 MED ORDER — LORAZEPAM 2 MG OR TABS
2.0000 mg | ORAL_TABLET | ORAL | Status: DC | PRN
Start: 2020-09-04 — End: 2020-09-04

## 2020-09-04 MED ORDER — SODIUM CHLORIDE 0.9 % IV SOLN
1000.0000 mg | INTRAVENOUS | Status: DC
Start: 2020-09-05 — End: 2020-09-05
  Administered 2020-09-05 (×2): 1000 mg via INTRAVENOUS
  Filled 2020-09-04: qty 1000

## 2020-09-04 NOTE — ED Provider Notes (Signed)
CHIEF COMPLAINT  Alcohol Problem (BIB [facility] for Alcohol withdrawls. PT denes any SI,HI,VH or AH Last ETOH intake yesterday)      HISTORY OF PRESENT ILLNESS:   Alexandria Olson is a 30 year old female who presents with alcohol withdrawal  [facility]. Pt slow to answer. Does not answer many questions. Endorses hallucinations will not elaborate. Denies si/hi/vh. Last eoh yesterday. Pt does not answer any other questions    Location: Pt does not answer any other questions    Radiation: Pt does not answer any other questions  Pt does not answer any other questions    Quality: Pt does not answer any other questions  Pt does not answer any other questions  Pt does not answer any other questions  Pt does not answer any other questions     Severity: Pt does not answer any other questions    Duration: Pt does not answer any other questions    Timing: Pt does not answer any other questions      REVIEW OF SYSTEMS:  Pt not cooperating to answer questions  Pt does not answer any other questions      PAST MEDICAL HISTORY:  No past medical history on file.   Patient Active Problem List    Diagnosis Date Noted   . Alcohol withdrawal, with unspecified complication (CMS-HCC) 09/04/2020     unknown    SURGICAL HISTORY:  No past surgical history on file.   unknown    ALLERGIES:  No Known Allergies   unknown     CURRENT MEDICATIONS:   Please see nursing notes    FAMILY HISTORY:  Reviewed and considered non-contributory   unknown     SOCIAL HISTORY:  Tobacco: unknown  Alcohol: +  Drug use: unknown    VITAL SIGNS:  First Vitals [09/04/20 0823]   Temperature Heart Rate Respirations Blood pressure (BP) SpO2   97.9 F (36.6 C) 93 18 (!) 129/93 98 %       PHYSICAL EXAM:  General: Awake, slow to respond not alert, appears to be in no apparent distress   Head: Normocephalic, atraumatic   Eyes: No scleral icterus, no conjunctival injection   ENT: Normal appearing ears externally, normal appearing nose externally. Would not open mouth  to stick out tongue  Neck: Supple, no tracheal deviation   Respiratory: Normal effort, no audible stridor , CTAB  Cardiovascular: Normal rate, regular rhythm, warm and well perfused        Abdomen: Soft and nontender    Back:no costovertebral angle tenderness  Skin: No jaundice, No rash   Extremities: No edema,   Neuro: Face symmetric, normal speech moving all 4 ext. Tremors in all 4 ext    MEDICAL DECISION MAKING:  Alexandria Olson is a 30 year old female who presents with alcohol withdrawal and ams. Differential diagnosis includes alcohol withdrawal, dt, alcoholic hallucinosis, drug intoxication, drug withdrawal, anemia, electrolyte abnl, ich, uti, pna Will obtain labs imaging    I have reviewed any labs/imaging/prior records and the relevant findings are summarized in my H&P or ED course. The patient was alerted to all significant and incidental findings from the work up and agrees and understands the plan is for the patient to follow up with a primary care physician/and or the admission team about these findings.       ED COURSE:  Workup Summary       Value Comment By Time      30 yo with  alcohol withdrawal with tremors hallucinations and ams pending workup and admission  Radene Ou, MD 02/17 0848     Ammonia: 42 (Reviewed) Radene Ou, MD 02/17 [phone]      Beta Hydroxybutyrate: 0.42 (Reviewed) Radene Ou, MD 02/17 0940     COVID-19 Result: NOT DETECTED (Reviewed) Radene Ou, MD 02/17 [phone]     Influenza A, PCR: NOT DETECTED (Reviewed) Radene Ou, MD 02/17 [phone]     Influenza B, PCR: NOT DETECTED (Reviewed) Radene Ou, MD 02/17 [phone]      Pt improving with valium and hallucination improving persistent signs of alcohol withdrawal will give more valium  Radene Ou, MD 02/17 1018     Alcohol: NONE DETECTED (Reviewed) Radene Ou, MD 02/17 1018      CM aware  Radene Ou, MD 02/17 1020      Leuk Esterase: LARGE (Reviewed) Radene Ou, MD 02/17 1042      WBC: 143 (Reviewed) Radene Ou, MD  02/17 1042      Will treat for uti  Radene Ou, MD 02/17 1042      [facility] for admission [phone] Dr. Jonah Blue, MD 02/17 1100      Will admit pt  Radene Ou, MD 02/17 1103      Shortened pr no wpw pt can stay on tele inpatient can repeat ekg for signs of wpw Radene Ou, MD 02/17 1107      Admitted: 30 year old female admitted to internal medicine service for alcohol withdrawal-level of care: tele  Noberto Retort, MD 02/17 1208          DIAGNOSIS:    ICD-10-CM ICD-9-CM   1. Shortened PR interval  R94.31 794.31   2. Alcohol withdrawal syndrome with complication (CMS-HCC)  F10.239 291.81   3. Urinary tract infection with hematuria, site unspecified  N39.0 599.0    R31.9 599.70   4. Abnormality of gait  R26.9 781.2   5. Decreased activities of daily living (ADL)  Z78.9 V49.89       ATTENDING ATTESTATION:  I evaluated the patient concurrently with the Resident/Fellow.  I discussed the case with the Resident/Fellow and agree with the findings and plan as documented by the Resident/Fellow.  Any additions or revisions are included in the record as necessary.    Radene Ou, MD       Critical Care Note:  Upon my evaluation, this patient had evidence of acute end-organ impairment with high-probability of imminent deterioration unless treatment is rendered due to alcohol withdrawal requiring multiple intravenous benzodiazepines to prevent delirium tremens or seizures, which required my direct attention, intervention, and personal management.     I have personally provided 40 minutes of critical care time exclusive of time spent on separately billable procedures. Time includes review of laboratory data/radiology results/medical records, consultation with specialist physicians, discussion with family, documentation, and monitoring for potential decompensation. Interventions were performed as documented in the medical record.       Radene Ou, MD  09/04/20 [phone]

## 2020-09-04 NOTE — Interdisciplinary (Addendum)
Physical Therapy Evaluation and Discharge    Admitting Physician:  Houston Siren, MD  Admission Date 09/04/2020    Inpatient Diagnosis:   Problem List       Codes    Shortened PR interval    -  Primary ICD-10-CM: R94.31  ICD-9-CM: 794.31    Alcohol withdrawal syndrome with complication (CMS-HCC)     ICD-10-CM: F10.239  ICD-9-CM: 291.81    Urinary tract infection with hematuria, site unspecified     ICD-10-CM: N39.0, R31.9  ICD-9-CM: 599.0, 599.70    Abnormality of gait     ICD-10-CM: R26.9  ICD-9-CM: 781.2          IP Start of Service   Start of Care: 09/04/20  Reason for referral: Activity tolerance limitation    Preferred Language:English         No past medical history on file.   No past surgical history on file.     PT Acute     Row Name 09/04/20 1500          Type of Visit    Type of Physical Therapy note Physical Therapy Evaluation and Discharge     Row Name 09/04/20 1500          Treatment Precautions/Restrictions    Precautions/Restrictions Fall     Fall Socks/charm;Bed/chair alarm     Other Precautions/Restrictions Information seizure precautions, PIV     Row Name 09/04/20 1500          Medical History    History of presenting condition Per chart, Alexandria Olson is a 30 year old female with a PMH significant for alcohol use disorder who presents for alcohol withdrawal. Symptoms of withdrawal started during the afternoon of 2/16. Her last drink was a bottle of tequila the morning of 2/16. Patient became concerned that she was having significant shaking of her arms bilaterally, chills, paranoia, and visual hallucinations and presented to [facility] ([place]. She denies past episodes of withdrawal symptoms. Per chart review, she was given a total of $Remove'2mg'dRrNQVg$  Ativan IV, Haldol, and Benadryl for hallucinations and agitation. She was noted to be more awake and alert and was discharged home though she feels that she was discharged too early. Since then, she feels her symptoms of hallucination and paranoia are worse.  Endorses visual hallucinations with crawling objects but does not elaborate further. Denies auditory hallucinations and suicidal/homicidal ideation. Denies chest pain, shortness of breath, and history of seizures.     Fall history Falls reported in the last 6 months     Row Name 09/04/20 1500          Functional History    Prior Level of Function No deficits     Equipment required for mobility in the home None     Row Name 09/04/20 1500          Social History    Living Situation Lives alone     [place]  2nd floor     [place] present     Number of steps to enter home -  one flight with handrails     Row Name 09/04/20 1500          Subjective    Subjective Information cleared for PT by RN     Patient status Patient agreeable to treatment;Nursing in agreement for treatment     Row Name 09/04/20 1500          Pain Assessment    Pain Asssessment  Tool Numeric Pain Rating Scale     Row Name 09/04/20 1500          Numeric Pain Rating Scale    Pain Intensity - rating at present 0     Pain Intensity- rating after treatment 0     Row Name 09/04/20 1500          Objective    Overall Cognitive Status Intact - no cognitive limitations or impairments noted     Communication No communication limitations or impairments noted. Current status of hearing, speech and vision allow functional communication.     Other  Communication Information low voice tone     Coordination/Motor control Other (comment)     Coordination/Motor Control  Information UE coordination impaired     Balance No balance limitations or impairments noted. Patient is able to maintain balance during mobility skills and daily activities.     Static Sitting Balance Normal - able to maintain steady balance without handhold support     Dynamic Sitting Balance Normal - accepts maximal challenge and can shift weight easily within full range in all directions     Static Standing Balance Normal - able to maintain steady balance without  handhold support     Dynamic Standing Balance Good - accepts moderate challenge, able to maintain balance while picking object off floor     Extremity Assessment Flexibility, strength, muscle tone and sensation grossly within functional limits throughout     Functional Mobility Functional mobility deficits present     Bed Mobility Independent     Transfers to/from Stand Independent     Gait Supervised     Gait Comments no LOB, steady     Device used for ambulation/mobility None     Ambulation Distance 300 ft     Step Navigation up/down 4 step step-over-step pattern with no LOB, did not use habdrail (limited due to IV short line)                      Eval cont.     [place] Name 09/04/20 1500          [place] AM-PAC: Basic Mobility    Assistance Needed to Turn from Back to Side While in a Flat Bed Without Using Bedrails 4 - None (independent)     Difficulty with Supine to Sit Transfer 4 - None (independent)     How Much Help Needed to Move to/from Bed to Chair 4 - None (independent)     Difficulty with Sit to Stand Transfer from Chair with Arms 4 - None (independent)     How Much Help Needed to Walk in Room 3 - A little (supervised/min assist)     How Much Help Needed to Climb 3-5 Steps with a Rail 3 - A little (supervised/min assist)     AMPAC Total Score 22     Assessment: AM-PAC Basic Mobility Impairment Rating Score 19-22 - 20-39% impaired     Row Name 09/04/20 1500          Patient/Family Education    Learner(s) Patient     Learner response to rehab patient education interventions Verbalizes understanding;Able to return demonstrate teaching     Patient/family training comments safety and fall prevention     Row Name 09/04/20 1500          Assessment    Assessment Pt with "PMH significant for alcohol use disorder" was agreeable to PT. Sitting BP 140/85. Initially with UE  tremors while holding arms up but no more tremors noted during OOB mobility, gait, and stair training. Tolerated session and stayed asymptomatic. No  further skilled PT required. Clear to ambulate in unit with no AD as tolerated with nursing.      Rehab Potential Excellent     Row Name 09/04/20 1500          Patient stated Goal    Patient stated goal non stated     [place] Name 09/04/20 1500          Planned Therapy Interventions and Rationale    Gait Training to normalize gait pattern and improve safety while ambulating;to normalize gait pattern and improve safety while ambulating with assistive device;to improve safety with stair navigation     Therapeutic Activities to improve functional mobility and ability to navigate in the home and/or community;to improve transfers between surfaces;to restore functional performace using graded activities     [place] Name 09/04/20 1500          Treatment Plan Disussion    Treatment Plan Discussion and Agreement Patient/family/caregiver stated understanding and agreement with the therapy plan     Row Name 09/04/20 1500          Treatment Plan    Frequency of treatment Patient appropriate for discharge from therapy     Duration of treatment (number of visits) One time only, further treatment not indicated     [place] Name 09/04/20 1500          Patient Safety Considerations    Patient safety considerations Patient returned to bed at end of treatment;Call light left in reach and fall precautions in place;Patient left  in appropriate pressure relieving position;Patient may be at risk for falls;Nursing notified of safety considerations at end of treatment  bed alarm on. all needs in reach, [place] RN aware     Patient assistive device requirements for safe ambulation No device required     Row Name 09/04/20 1500          Therapy Plan Communication    Therapy Plan Communication Discussed therapy plan with Nursing and/or Physician;Encouraged out of bed with assistance by     Encouraged out of bed with assistance by Nursing;Staff  ambulate as tolerated     Row Name 09/04/20 1500          Physical Therapy Patient Discharge Instructions    Your  Physical Therapist suggests the following -  ambulate as tolerated     Row Name 09/04/20 1500          Discharge Report    Discharge Report Date 09/04/20     Reason for discharge Goals met, no further skilled treatment indicated     Patient participation Good, patient participated in at least 75% of all treatment sessions     Patient compliance with therapy program Excellent     Response to therapy Excellent     Row Name 09/04/20 1500          Type of Eval    Moderate Complexity (97162) Completed     Row Name 09/04/20 1500          Therapeutic Procedures    Gait Training [id number]) Dynamic activities while walking;Gait pattern analysis and treatment of deviations;Gait training with varying resistance or speed;Patient education;Stair/curb/obstacle navigation training;Weight shift and postural control activities during gait        Total TIMED Treatment (min)  25     Therapeutic Activities (94765)  Assistance/facilitation of bed  mobility;Dynamic activities to improve performance of  functional tasks/activities;Facillitation of safety awareness/responses during functional tasks;Functional activities;Progressive mobilization to improve functional independence;Patient education;Transfer training with weight shift and direction change;Weight shift activities to improve safety in unsupported sitting or standing        Total TIMED Treatment (min)  15     Row Name 09/04/20 1500          Treatment Time     Total TIMED Treatment  (min) 40     Total Treatment Time (min) 70     Treatment start time 1405               Post Acute Discharge Recommendations  Discharge Rehabilitation Reccomendations [facility] ONLY): None- patient currently  has no further skilled therapy needs  Equipment recommendations: No equipment needed - patient has own equipment    The physical therapist of record is endorsed by evaluating physical therapist.

## 2020-09-04 NOTE — Interdisciplinary (Signed)
Occupational Therapy Evaluation and Discharge    Admitting Physician:  Adan Sis, MD  Admission Date 09/04/2020    Inpatient Diagnosis:   Problem List       Codes    Shortened PR interval    -  Primary ICD-10-CM: R94.31  ICD-9-CM: 794.31    Alcohol withdrawal syndrome with complication (CMS-HCC)     ICD-10-CM: F10.239  ICD-9-CM: 291.81    Urinary tract infection with hematuria, site unspecified     ICD-10-CM: N39.0, R31.9  ICD-9-CM: 599.0, 599.70    Abnormality of gait     ICD-10-CM: R26.9  ICD-9-CM: 781.2    Decreased activities of daily living (ADL)     ICD-10-CM: Z78.9  ICD-9-CM: V49.89          IP Start of Service  Start of Care: 09/04/20  Reason for referral: Activity tolerance limitation;Decline in functional ability/mobility;Decline in performance of activities of daily living (ADL)    Preferred Language:English         No past medical history on file.   No past surgical history on file.     OT Acute     Row Name 09/04/20 1600          Type of Visit    Type of Occupational Therapy note Occupational Therapy Evaluation and Discharge     Row Name 09/04/20 1600          Treatment Time    Treatment Start Time 1330     Total TIMED Treatment (min) 30     Total Treatment Time (min) 60     Row Name 09/04/20 1600          Treatment Precautions/Restrictions    Precautions/Restrictions Fall;Multiple lines     Fall Socks/charm     Other Precautions/Restrictions Information IV, tele     Row Name 09/04/20 1600          Medical History    History of presenting condition Per chart, pt "is a 30 year old female with a PMH significant for alcohol use disorder who was admitted for alcohol withdrawal c/b alcoholic hallucinosis"     Fall history Falls reported in the last 6 months;Discussed fall prevention education with patient     Row Name 09/04/20 1600          Functional History    Prior Level of Function No deficits     General ADL/Self-Care Assistance Needs None- Independent with ADLs and self care     Equipment required  for mobility in the home None     Row Name 09/04/20 1600          Social History    Living Situation Lives alone     Home Environment Apartment     Home accessibility Stairs present     Number of steps to enter home -  2nd story apartment, no elevator     Other Social History Information Pt previously fully IND, drives, works as a Leisure centre manager. Stated family is nearby if needs help     Row Name 09/04/20 1600          Subjective    Subjective information Pt sleepy but arousable. Pt agreeable to participate and requested to use bathroom. Pt cleared by RN to participate     Patient status Patient agreeable to treatment;Nursing in agreement for treatment;Patient pain control adequate to participate in therapy     Row Name 09/04/20 1600          Pain Assessment  Pain Asssessment Tool Numeric Pain Rating Scale     Row Name 09/04/20 1600          Numeric Pain Rating Scale    Pain Intensity - rating at present 0     Pain Intensity- rating after treatment 0     Row Name 09/04/20 1600          Activities of Daily Living (ADLs)    Self Feeding Independent     Self Grooming Independent     Other Self Grooming Information standing sinkside     Upper Body Dressing Modified independent     Lower Body Dressing Supervised     Bathing Supervised     Toileting Supervised     Other Toileting Information seated on toilet     Toilet Transfers Supervised     Other Toilet Transfers Information hand held assist, use of grab bars     Row Name 09/04/20 1600          [place] AM-PAC: Daily Activity    Assistance Needed to Put on and Take off Regular Lower Body Clothing 3     Assistance Needed to Bathe, Including Washing, Rinsing, and Drying 3     Assistance Needed to Toilet Agricultural consultant, Bedpan, or Urinal) 3     Assistance Needed to Put on and Take off Regular Upper Body Clothing 4     Assistance Needed to Take Care of Personal Grooming Such as Brushing Teeth 4     Assistance Needed to Eat Meals 4     AM-PAC Daily Activity Total Score 21     AMP-PAC  Daily Activity Impairment rating Score 20-22 - 20-39% impaired     Row Name 09/04/20 1600          Objective    Overall Cognitive Status Intact - no cognitive limitations or impairments noted     Communication No communication limitations or impairments noted. Current status of hearing, speech and vision allow functional communication.     Coordination/Motor control Abnormal Muscle tone present     Coordination/Motor Control  Information pt with BUE tremors likely due to alcohol withdrawal     Balance Balance limitations present     Static Sitting Balance Good - able to maintain balance without handhold support, limited postural sway     Dynamic Sitting Balance Good - accepts moderate challenge, able to maintain balance while picking object off floor     Static Standing Balance Good - able to maintain balance without handhold support, limited postural sway     Dynamic Standing Balance Fair - accepts minimal challenge, able to maintain balance while turning head/trunk     Extremity Assessment Flexibility, strength, muscle tone and sensation grossly within functional limits throughout     Other  Extremity Assessment  Information demo BUE strength and ROM WFL; noted BUE tremors     Functional Mobility Functional mobility deficits present     Bed Mobility Independent     Transfers to/from Stand Independent     Ambulation during functional tasks Supervised     Device used for ambulation/mobility None     Ambulation Distance household distances in unit     Other Objective Findings BP during session 140s/60s, denied dizziness with positional changes. Pt did not report visual hallucinations this session. Demo fair balance during OOB activities but min cues for safety. Noted BUE tremors likely due to alcohol withdrawal. Completed bedside ADLs, see above. Pt able to have BM and urinate in toilet, RN  notified. Pt assisted back to bed supine, bed alarm on, call light in reach. Educated on safety, pt acknowledged.                 OT Acute Tool Box     Row Name 09/04/20 1600          Cognition Assessment    Overall Cognitive Status Intact - no cognitive limitations or impairments noted                    Eval cont.     Row Name 09/04/20 1600          Patient/Family Education    Learner(s) Patient     Learner response to rehab patient education interventions Verbalizes understanding     Patient/family training comments safety     Row Name 09/04/20 1600          Assessment    Assessment Pt is a 30 year old female admitted for alcohol withdrawal. Pt was seen for OT eval. Demo to be near her functional baseline, completing ADLs and transfers with sup A at most. Anticipate pt to increase her safety and independence with ADLs with medical tx. Acute OT not indicated, will sign off. Encourage supervision and assist for ADLS and transfers with nursing     Rehab Potential Good     Row Name 09/04/20 1600          Treatment Plan Disussion    Treatment Plan Discussion and Agreement Patient/family/caregiver stated understanding and agreement with the therapy plan     Row Name 09/04/20 1600          Treatment Plan    Duration of treatment (number of visits) One time only, further treatment not indicated     Status of treatment One time only treatment, further skilled therapy not indicated     Row Name 09/04/20 1600          Patient Safety Considerations    Patient safety considerations Patient returned to bed at end of treatment;Call light left in reach and fall precautions in place     Patient assistive device requirements for safe ambulation No device required     Row Name 09/04/20 1600          Post Acute Discharge Recommendations    Discharge Rehabilitation Reccomendations [facility] ONLY) None- patient currently  has no further skilled therapy needs     Equipment recommendations No equipment needed - patient has own equipment     Row Name 09/04/20 1600          Therapy Plan Communication    Therapy Plan Communication Discussed therapy plan with Nursing  and/or Physician     Row Name 09/04/20 1600          Occupational Therapy Patient Discharge Instructions    Your Occupational Therapist suggests the following Continue to complete your self care Activities of Daily Living as frequently as possible;Continue to use energy conservation, pursed lip breathing and self-pacing when completing your self care Activities of Daily Living     Row Name 09/04/20 1700          Type of Eval    Low Complexity [phone]) Completed     Row Name 09/04/20 1700          Therapeutic Procedures    Self-Care/ADL Training (10626) Activities of daily living training;Patient education         Total TIMED Treatment (min) 15  The occupational therapist of record is endorsed by evaluating occupational therapist.

## 2020-09-04 NOTE — Interdisciplinary (Signed)
09/04/20 1638   Assessment   Assessment Type Initial;Face to Face;Verbal   Referral Information   Referral Type Substance Abuse   Social Assessment   Where was the patient admitted from? * Home  (Per chart)   Prior to Level of Function * Ambulatory/Independent with ADL's   Assistive Device * Not applicable   Primary Caretaker(s) * Self   Primary Contact Name, Number and Relationship * Nadara Eaton, Friend, [phone]   Permission to Contact * Yes   Secondary Contact Name, Number and Relationship N/A   Is Patient Minor? No   Guardian N/A   Interpreter Used? Not Needed   Cultural/Religious Beliefs None identified at this time   Education Not a Student   School Name/Last Attended N/A   Literacy Can read;Can write   Social [place] *   (Pt did not answer)   Post Acute Services Referred To Other (Comment)  (CSW provided [place], [place] Substance Abuse resource handout)   [place] Name, Number, Sport and exercise psychologist Details CSW provided [place], [place] Substance Abuse resource handout   Post Acute Resources Provided Other (Comment)  (CSW provided [place], [place] Substance Abuse resource handout)   A List of Tax adviser Provided Not Applicable   Home Care Services * No   Additional Services Not Applicable   Has discharge transport been arranged? No   Transportation Arrangement Details * Pt stated plan to utilize her phone to arrange an uber for d/c to home address [place].   Transportation Company/Phone Number *  Designer, jewellery *  Other (Comment)  (Pt reported uber)   Patient/Family/Other Engaged in Discharge Planning * Yes   Name, Relationship and Phone Number of Person Engaged in the Discharge Plan Pt- self   Patient/Family/Legal/Surrogate Decision Maker Has Been Given a List Options And Choice In The Selection of Post-Acute Care Providers * Not Applicable   Family/Caregiver's Assessed for * Not Applicable   Respite  Care * Not Applicable   Patient/Family/Other Are In Agreement With Discharge Plan * To be determined   Primary Care Access Assigned PCP   Medication Compliance Other (comment)  (Pt reported none)   Involvement with Law None   Income Information   Income Source Employed   Financial Resources Other (Comment)  Chief Technology Officer)   [place]   Past Mental Health Issues Pt denied any history of or current mental health issues at this time   Mental Status No issues   Behavioral Assessment No issues   Physical Assessment No issues   Mental Status - Orientation Patient alert & oriented x4, though appeared tired and declined to fully complete assessment at this time   Have you experienced any neglect or abuse in the past? No   Past Neglect or Abuse re-assessed at discharged No reported past neglect or abuse   Notify Treatment Team to Assess Patient's Capacity? No concerns at this time   Adjustment to Illness   Patient's Adjustment Acceptance   Family's Adjustment Family not available   Over the past two weeks, how often have you been bothered by any of the following problems?   Little interest or pleasure in doing things 0   Feeling down, depressed, or hopeless 0   Depression Scale Subtotal - If Greater than or Equal to 3, complete the following two questions 0   Substance Abuse History (CAGE-AID)   Have you ever felt you ought  to cut down on your drinking or drug use? 0   Have people annoyed you by criticizing your drinking or drug use? 0   Have you ever felt bad or guilty about your drinking or drug use? 0   Have you ever had a drink or used drugs first thing in the morning to steady your nerves or to get rid of a hangover? 0   Number of "Yes" Responses 0   Substance Abuse History Pt endorsed history of alcohol abuse and accepted resources. CSW provided [place], [place] Substance Abuse resource handout.   Referral To   Substance Abuse Referral Your next level of care provider will manage your addictions  treatment  (CSW provided [place], [place] Substance Abuse resource handout)   Emergency planning/management officer Other (Comment)  (CSW provided [place], [place] Substance Abuse resource handout)   Plans/Interventions/Discharge   Plan/Interventions Resources given   Anticipated Discharge Destination Unable to determine at this time   Discharge Resources Given CSW provided [place], [place] Substance Abuse resource handout   Barriers to Discharge * Clinical reason   Do you have difficulty affording your medications No     Alcohol Use  How often do you have a drink containing alcohol?: Patient refused  AUDIT-C Total Score: 0     Education/Resources  Intervention/Education provided: Yes (CSW provided education about alcohol abuse and available resources.)  Resources provided for substance abuse counseling: Yes (CSW provided [place], [place] Substance Abuse resource handout)  Audit Screening Initiated/Completed with in 24 hrs of Admission: Completed  DAST Refusal: Pt refuses DAST      Float CCSW assisting covering [place] with substance abuse screening. Per chart, pt is identified as 30 y/o (DOB: 25-Apr-1991) English-speaking female [place] who presented to [facility] for alcohol withdrawal.    CSW met with pt at bedside to introduce self and role on care team. Pt was asleep but awoke to verbal prompting from this Probation officer. Pt verbalized understanding and stated agreement to speak with social worker at this time. CSW engaged pt regarding living situation, mental health, and substance abuse history. Pt report living at address on facesheet but did not provide additional information about living situation (type of home, others in the home etc). Pt closed eyes and turned to side. Pt awoke to verbal prompting and CSW engaged regarding above assessment. Pt endorsed experiencing symptoms of depression in the past but denied any history of formal diagnosis or mental health services. CSW  offered resources for support, pt declined. Pt endorsed history of alcohol abuse and was receptive to supportive resources. CSW provided [place], [place] Substance Abuse resource handout.    CSW engaged pt regarding discharge planning. Pt reported she would order herself an [place] to address on [place] and declined to further participate in assessment. Pt closed eyes and turned over. CSW provided update to RN.    No additional social work needs identified at this time. CSW to remain available for support as needed.    Ellin Saba, MSW  [phone]

## 2020-09-04 NOTE — H&P (Signed)
IM ADMISSION HISTORY & PHYSICAL NOTE - [place]     Date of Evaluation: 09/04/2020    Attending Physician: Houston Siren, MD    Service: Medicine Hospitalist Team C     History of Presenting Illness:     Alexandria Olson is a 30 year old female with a PMH significant for alcohol use disorder who presents for alcohol withdrawal    Symptoms of withdrawal started during the afternoon of 2/16. Her last drink was a bottle of tequila the morning of 2/16. Patient became concerned that she was having significant shaking of her arms bilaterally, chills, paranoia, and visual hallucinations and presented to [facility]. She denies past episodes of withdrawal symptoms. Per chart review, she was given a total of 49m Ativan IV, Haldol, and Benadryl for hallucinations and agitation. She was noted to be more awake and alert and was discharged home though she feels that she was discharged too early. Since then, she feels her symptoms of hallucination and paranoia are worse. Endorses visual hallucinations with crawling objects but does not elaborate further. Denies auditory hallucinations and suicidal/homicidal ideation. Denies chest pain, shortness of breath, and history of seizures.    She notes an increase in her binge drinking since November 2021. Denies major life events or stressors at that time that set off increased drinking. Reports a history of depression symptoms but has never been on any pharmacotherapy. Identifies that she just likes the party scene as a cause for her increase drinking. Unable to tell me how much and how often she is drinking now, but reports drinking liquor. 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 Interested in resources for quitting or cutting back alcohol use.     ED Course:  ED Course as of 09/04/20 1222   Others' Documentation   Thu Sep 04, 2020   1208  Admitted: 30year old female admitted to internal medicine service for alcohol withdrawal  -level of care: tele    [CM]   1107 Shortened pr no wpw pt can stay on tele inpatient can repeat ekg for signs of wpw [EH]   1103 Will admit pt  [EH]   1Uptonauthorization for admission [id number]. HSilverio Decamp[CM]   1042 Will treat for uti  [EH]   1042 WBC(!): 143 [EH]   1042 Leuk Esterase(!): LARGE [EH]   1020 CM aware  [EH]   1018 Alcohol: NONE DETECTED [EH]   1018 Pt improving with valium and hallucination improving persistent signs of alcohol withdrawal will give more valium  [EH]   0950 Influenza B, PCR: NOT DETECTED [EH]   0950 Influenza A, PCR: NOT DETECTED [EH]   0950 COVID-19 Result: NOT DETECTED [EH]   0940 Beta Hydroxybutyrate(!): 0.42 [EH]   0928 Ammonia: 473[EH]   0848 30yo with alcohol withdrawal with tremors hallucinations and ams pending workup and admission  [EH]      ED Course User Index  [CM] MMichelene Heady MD  [EH] HDelynn Flavin MD               Past History:      Past Medical/Surgical History:  No past medical history on file.    Home Medications:  No current facility-administered medications on file prior to encounter.     No current outpatient medications on file prior to encounter.  Family History:  Patient reported cardiac history in her mother but did not know further details    Social History:  Social History     Tobacco Use    Smoking status: Not on file    Smokeless tobacco: Not on file   Substance Use Topics    Alcohol use: Not on file    Drug use: Not on file       Allergies:  No Known Allergies      Objective:     Review of Systems:   Nutrition:  NPO Except for: All Meds  Gastrointestinal:  negative  Pain:  Pain Score: 0    More than 2 other Review of Systems is negative.  Please see above H&P details for additional ROS.    Selected Medications:  SCHEDULED MEDS:   [START ON 09/05/2020] cefTRIAXone (ROCEPHIN) IV (Adult)  1,000 mg Q24H NR    enoxaparin  40 mg Daily    folic  acid  1 mg Daily    multivitamin adult  1 tablet Daily    PHENobarbital  260 mg Once    polyethylene glycol  17 g Daily    senna  8.6 mg HS    thiamine  100 mg Daily       PRN MEDS:   acetaminophen  650 mg Q4H PRN    Or    acetaminophen  650 mg Q4H PRN    HYDROcodone-acetaminophen  1 tablet Q4H PRN    LORazepam  2 mg Q1H PRN    Or    LORazepam  4 mg Q1H PRN       Physical Exam:  Vital Signs:  Ht.: Height: _0  (170.2 cm),     BMI: Body mass index is 21.14 kg/m.  Temperature:  [97.9 F (36.6 C)] 97.9 F (36.6 C) (02/17 0823)  Blood pressure (BP): (125-129)/(54-93) 125/74 (02/17 1212)  Heart Rate:  [77-93] 77 (02/17 1212)  Respirations:  [16-18] 16 (02/17 1212)  Pain Score: 0 (02/17 0823)  O2 Device: None (Room air) (02/17 1212)  SpO2:  [98 %-99 %] 98 % (02/17 1212)    Nursing Notes Reviewed:     Intake/Output Summary (Last 24 hours) at 09/04/2020 1222  Last data filed at 09/04/2020 1110  Gross per 24 hour   Intake 1000 ml   Output --   Net 1000 ml                      General: Alert and oriented x3, not in acute distress, comfortable lying in bed  Eyes: PERRLA, no discharge, extra-ocular movements intact  Ears: No deformities on external ears, no discharge  Nose: No nasal discharge or sinus pain  Mouth: Moist mucous membranes  Neck: Supple, no rigidity  Cardiac: Tachycardic rate and rhythm; S1 and S2 present; no murmurs, rubs, or gallops; pulse 2+ in all 4 extremities  Pulmonary: Clear to auscultation bilaterally; normal respiratory effort  Abdomen: No abdominal tenderness to palpation; no rigidity, guarding, or rebound tenderness  Extremities: Small abrasions noted on bilateral lower extremities  Neuro: CN 2-12 intact; no focal deficits, + fine tremors in upper and lower extremities bilaterally. No asterixis   Psychiatric: Flat Affect, slow to answer questions, appears to be responding to internal stimuli by looking back and forth    Patient Lines/Drains/Airways Status     Active PICC Line / CVC Line /  PIV Line / Drain / Airway / Intraosseous Line / Epidural Line / ART Line /  Line Type / Wound     Name Placement date Placement time Site Days    Peripheral IV - 18 G Left Forearm 09/04/20  0847  Forearm  less than 1                 Diagnostic Data:     Laboratory Data:  Recent Labs     09/04/20  0847   WBCCOUNT 9.1   HGB 12.5   HCT 36.3   PLT 120*       Recent Labs     09/04/20  0847   SODIUM 133*   K 3.6   CL 98   CO2 26   BICARB 27.6*   BUN 11   CREAT 0.5*   GLU 101   [place] 8.5*   IONCA 1.09*   MG 2.0   PHOS 3.7   TPROT 7.2   ALB 4.1   ALK 80   TBILI 1.3   AST 50*   ALT 27   LACT 1.3     Recent Labs     09/04/20  0847   PROTIME 12.6   INR 0.98   PTT 22.5*       Cardiac Enzymes:  No results for input(s): TROPI, TCPK, CPK, CKMB, CKIND, BNP, DIGOX in the last 72 hours.    [place]: 8.5* (02/17)  Mg: 2.0 (02/17)  Ph: 3.7 (02/17)    Anion Gap: 9 (02/17)    Lactic Acid: 1.3 (02/17)    ABG:  pH   / pCO2   / pO2   / HCO3 27.6* (02/17) / SpO2    On FiO2 0.21 (02/17)    VBG: pH7.41* (02/17) PCO2 44.2 (02/17) PO2 29.8* (02/17)      BNP:      Thyroid Function:  TSH 3.412 (02/17) FT4        Lipid Panel: No results found for: CHOL, TRIG, HDLCH2, LDLCALC, VLDLC2, NHDL2    A1c: No results found for: A1C    MICROBIOLOGY  Microbiology Results (last 7 days)     Procedure Component Value - Date/Time    COVID-19, Flu A/B Panel (POC) [[id number]] Collected: 09/04/20 0853    Lab Status: Final result Specimen: Swab from Nasal-Pharyngeal Updated: 09/04/20 0947     Respiratory Virus Source SWAB     Comment: NASOPHARYNX        COVID-19 Result NOT DETECTED     Influenza A, PCR NOT DETECTED     Influenza B, PCR NOT DETECTED     Respiratory Virus Comment Reference Range: Not Detected     Comment: An interpretation of not detected cannot exclude the presence of specific   nucleic acid in concentrations below detection limits. The cobas [place]   SARS-CoV-2 and influenza A/B is a multiplexed real-time reverse transcription   polymerase chain reaction  (RT-PCR) intended for the qualitative detection and   differentiation of SARS-CoV-2, influenza A, and influenza B viral RNA.  This test has not been validated for use in asymptomatic individuals. Testing   may have decreased sensitivity in asymptomatic individuals, so caution should   be exercised when interpreting not detected results. Influenza A and influenza   B may be falsely negative in specimens that have a detected SARS-CoV-2 result.   Re-testing with an alternative influenza test is recommended if clinically   indicated.  This test has been authorized by the Food and Drug Administration (FDA) under an Emergency Use Authorization (EUA) for use at the point of care in patient  care settings operating under a Pensions consultant.               Other Diagnostic Data:  CT Head W/O Contrast    Result Date: 09/04/2020  No acute intracranial abnormality.      Assessment & Plan:         Prolonged QTc, short PR inteval    IMPRESSION:  Alexandria Olson is a 30 year old female with a PMH significant for alcohol use disorder who was admitted for alcohol withdrawal c/b alcoholic hallucinosis    ASSESSMENT AND PLAN:    #Alcohol withdrawal c/b alcoholic hallucinosis. Last drink 24 hours prior to admission. Patient with visual hallucinations and tremors but otherwise was hemodynamicaly stable. Reports first episode of withdrawal symptoms and has never had seizures. S/p Valium 5 mg x 1, 10 mg x 1. CIWA 16, 19 in the ED.  - Admit to medicine on telemetry  - CIWA scores per alcohol withdrawal protocol  - UDS  - phenobarbital loading dose of 260 mg  - Ativan  2 mg q1h PRN for CIWA 10-15, Ativan 4 mg q1h PRN for CIWA 16+  - mIVF:D5-NS 60 cc/hr  - CMP, Mg, Phos daily. Replete PRN  - Thiamine 003 mg daily, folic acid 1 mg daily  - Phenergan for nausea  - Seizure precautions  - PT/OT  - SW consult for alcohol cessation, financial resources    #Urinary tract infection not meeting sepsis criteria  - Follow urine culture  -  Blood culture if fevers  - CTX 1g q24h for 5 days (2/17-)    Fluid therapy and Feeding: NPO,   Analgesia, antiemetics and ADT (AAA): acetaminophen   Sedation and SBT: None  Thromboprophylaxis: lovenox   Head up position (30 degrees) if intubated: N/A  Ulcer prophylaxis: not indicated   Glucose control: not indicated   Skin/ eye care and suctioning: N/A  Bowel cares: miralax, senna PRN  Indwelling catheter/tube: N/A  De-escalation (e.g. end of life issues, treatments no longer needed): N/A    Code Status: Orders Placed This Encounter      Full Code / Full resuscitative therapy / full diagnostic & therapeutic care      To be staffed with attending, Dr. Houston Siren, MD, in the morning.    Leotis Pain, MD, PGY 1  Resident Physician - [place]  Pager [phone]        ATTENDING ATTESTATION:

## 2020-09-04 NOTE — ED Notes (Signed)
Bed: EDA-17  Expected date:   Expected time:   Means of arrival:   Comments:  Bed 20 for [facility]

## 2020-09-04 NOTE — ED Notes (Signed)
Admitting MD at bedside.

## 2020-09-04 NOTE — ED Notes (Signed)
Will continue to run IV dextrose despite having a diet order. Pt drowsy And difficult to arouse. Recommendations were made by Dr. Fulton Mole

## 2020-09-04 NOTE — Interdisciplinary (Signed)
[facility] CALLED [phone] MD TO MD COMPLETED DR [facility] OBTAINED AUTH TO ADMIT TO [place].

## 2020-09-04 NOTE — ED EKG Interpretation (Signed)
ED EKG Interpretation    EKG: sinus with Normal Axis and pr interval 116 no delta wave .

## 2020-09-05 ENCOUNTER — Other Ambulatory Visit: Payer: Self-pay

## 2020-09-05 DIAGNOSIS — R442 Other hallucinations: Secondary | ICD-10-CM

## 2020-09-05 LAB — CBC WITH DIFF, BLOOD
ANC automated: 2.9 10*3/uL (ref 2.0–8.1)
Basophils %: 0.5 %
Basophils Absolute: 0 10*3/uL (ref 0.0–0.2)
Eosinophils %: 7.2 %
Eosinophils Absolute: 0.4 10*3/uL (ref 0.0–0.5)
Hematocrit: 35.6 % (ref 34.0–44.0)
Hgb: 12 G/DL (ref 11.5–15.0)
Lymphocytes %: 26.8 %
Lymphocytes Absolute: 1.3 10*3/uL (ref 0.9–3.3)
MCH: 32.2 PG (ref 27.0–33.5)
MCHC: 33.8 G/DL (ref 32.0–35.5)
MCV: 95.3 FL (ref 81.5–97.0)
MPV: 8.5 FL (ref 7.2–11.7)
Monocytes %: 7.6 %
Monocytes Absolute: 0.4 10*3/uL (ref 0.0–0.8)
Neutrophils % (A): 57.9 %
PLT Count: 101 10*3/uL — ABNORMAL LOW (ref 150–400)
RBC: 3.74 10*6/uL (ref 3.70–5.00)
RDW-CV: 14.5 % — ABNORMAL HIGH (ref 11.6–14.4)
White Bld Cell Count: 5 10*3/uL (ref 4.0–10.5)

## 2020-09-05 LAB — URINE CULTURE
Culture Result: 3
Culture Result: >100000 — AB

## 2020-09-05 LAB — COMPREHENSIVE METABOLIC PANEL, BLOOD
ALT: 34 U/L (ref 7–52)
AST: 55 U/L — ABNORMAL HIGH (ref 13–39)
Albumin: 3.8 G/DL (ref 3.7–5.3)
Alk Phos: 71 U/L (ref 34–104)
BUN: 8 mg/dL (ref 7–25)
Bilirubin, Total: 0.8 mg/dL (ref 0.0–1.4)
CO2: 24 mmol/L (ref 21–31)
Calcium: 8.2 mg/dL — ABNORMAL LOW (ref 8.6–10.3)
Chloride: 103 mmol/L (ref 98–107)
Creat: 0.5 mg/dL — ABNORMAL LOW (ref 0.6–1.2)
Electrolyte Balance: 8 mmol/L (ref 2–12)
Glucose: 112 mg/dL (ref 70–115)
Potassium: 3.6 mmol/L (ref 3.5–5.1)
Protein, Total: 6.8 G/DL (ref 6.0–8.3)
Sodium: 135 mmol/L — ABNORMAL LOW (ref 136–145)
eGFR - high estimate: >60 (ref >59)
eGFR - low estimate: >60 (ref >59)

## 2020-09-05 LAB — GLUCOSE, POINT OF CARE
Glucose, Point of Care: 121 MG/DL (ref 70–125)
Glucose, Point of Care: 90 MG/DL (ref 70–125)

## 2020-09-05 LAB — MAGNESIUM, BLOOD: Magnesium: 2.2 mg/dL (ref 1.9–2.7)

## 2020-09-05 LAB — MRSA CULTURE: Culture Result: NEGATIVE

## 2020-09-05 LAB — BILIRUBIN, DIR BLOOD: Bilirubin, Direct: 0.2 mg/dL (ref 0.0–0.2)

## 2020-09-05 LAB — PHOSPHORUS, BLOOD: Phosphorus: 2.8 MG/DL (ref 2.5–5.0)

## 2020-09-05 MED ORDER — HYDROXYZINE HCL 25 MG OR TABS
25.0000 mg | ORAL_TABLET | Freq: Once | ORAL | Status: AC
Start: 2020-09-05 — End: 2020-09-05
  Administered 2020-09-05 (×2): 25 mg via ORAL
  Filled 2020-09-05: qty 1

## 2020-09-05 MED ORDER — NALTREXONE HCL 50 MG OR TABS
50.0000 mg | ORAL_TABLET | Freq: Every day | ORAL | 0 refills | Status: AC
Start: 2020-09-05 — End: ?

## 2020-09-05 MED ORDER — NALTREXONE HCL 50 MG OR TABS
50.0000 mg | ORAL_TABLET | Freq: Every day | ORAL | 0 refills | Status: DC
Start: 2020-09-05 — End: 2020-09-05

## 2020-09-05 NOTE — Plan of Care (Signed)
Problem: Promotion of health and safety  Goal: Promotion of Health and Safety  Description: The patient remains safe, receives appropriate treatment and achieves optimal outcomes (physically, psychosocially, and spiritually) within the limitations of the disease process by discharge.  Outcome: Progressing  Flowsheets (Taken 09/05/2020 0529)  Standard of Care/Policy:   Telemetry   Falls Reduction  Outcome Evaluation (rationale for progressing/not progressing) every shift: Pt A&Ox4. denies chest pain/SOB. VSS. NSR on tele. CIWA remains 0 overnight.  Individualized Interventions/Recommendations (Discharge Readiness): Antibiotics. Tele monitoring. CIWA assessment.  Individualized Interventions/Recommendations (Skin/Comfort/Safety/Mobility): Skin intact. Pain relieved with Tylenol, pt in no apparent distress. Fall precautions maintained. Pt self positions indep in bed.  Individualized Interventions/Recommendations (Knowledge): Pt educated on POC.   BP  Min: 125/74  Max: 138/99  Temp  Min: 97.6 F (36.4 C)  Max: 97.9 F (36.6 C)  Pulse  Min: 69  Max: 93  Resp  Min: 16  Max: 18  SpO2  Min: 98 %  Max: 99 %  Height  Min: 5\' 7"  (170.2 cm)  Max: 5\' 7"  (170.2 cm)  Weight  Min: 61.2 kg (135 lb)  Max: 61.2 kg (135 lb)    BP 135/83 (BP Location: Right arm, BP Patient Position: Semi-Fowlers)    Pulse 69    Temp 97.6 F (36.4 C)    Resp 18    Ht 5\' 7"  (1.702 m)    Wt 61.2 kg (135 lb)    SpO2 99%    BMI 21.14 kg/m   Nursing Shift Summary  Overall Mobility/Safe Patient Handling  Patient Current Activity Level: 1  Level of assistance/BMAT: BMAT 4- independent OR supervision for fall risk

## 2020-09-05 NOTE — Discharge Instructions (Signed)
-   Please cut back or stop heavy alcohol use. We have provided resources for alcohol cessation  - Please continue your home medications  - Please establish and follow-up with your PCP. They can also help address anxiety  - Please see your upcoming future appointments below  - Return to the ED if you experience chest pain, shortness of breath, confusion, seizures, or hallucinations

## 2020-09-05 NOTE — Interdisciplinary (Signed)
09/05/20 1206   Initial Assessment   CM Initial Assessment * Completed   Patient Information   Where was the patient admitted from? * Home   Prior to Level of Function * Ambulatory/Independent with ADL's   Assistive Device * Not applicable   Primary Caretaker(s) * Self   Primary Contact Name, Number and Relationship * Elizbeth Squires, Friend, [phone]   Permission to Contact * Yes   Income Information   Income Source Employed   Military History Not Applicable   Veterans Affiliation No   Discharge Planning   Available Assistance/Support System * Case manager/social worker;Friends / neighbors   Home Care Services * No   Additional Services Not Applicable   Anticipated Discharge Disposition/Needs Home   Patient's Discharge Goal(s) Home   Do you have difficulty affording your medications No   Patient/Family/Other Engaged in Discharge Planning * Yes   Name, Relationship and Phone Number of Person Engaged in the Discharge Plan self   Patient Has Decision Making Capacity * Yes   Patient/Family/Legal/Surrogate Decision Maker Has Been Given a List Options And Choice In The Selection of Post-Acute Care Providers * Not Applicable   Family/Caregiver's Assessed for * Not Applicable   Respite Care * Not Applicable   Patient/Family/Other Are In Agreement With Discharge Plan * Yes   Public Health Clearance Needed * Not Applicable   Social Worker Consult   Do you need to see a social worker? * Yes   Readmission Risk Assessment   Readmission Within 30 Days of Discharge * No   Recent Hospitalizations (Within Last 6 Months) * No   High Risk For Readmission * Yes   High Risk Indicators History of multiple emergent care use;Substance abuse   Action Taken To Prevent Readmission After Discharge Social work evaluation and recommendations  (spoke w/Tamara, SW - provided info- pt here r/t ETOH withdraw.)   MOON   MOON Provided to Patient Not Applicable

## 2020-09-05 NOTE — Discharge Summary (Signed)
DISCHARGE SUMMARY     Patient Name:  Alexandria Olson    Date of Admission:  09/04/2020  Date of Discharge:  09/05/20  Discharge Disposition:  Home     Principal Diagnosis:  Alcohol withdrawal     Follow Up Recommendations:  - Please cut back or stop heavy alcohol use. We have provided resources for alcohol cessation  - Please continue your home medications  - Please establish and follow-up with your PCP. They can also help address anxiety  - Please see your upcoming future appointments below  - Return to the ED if you experience chest pain, shortness of breath, confusion, seizures, or hallucinations      Follow Up Appointments:  Scheduled appointments:  No future appointments.  - You will be called in regards to future appointments  - If you do not receive a call for an appointment within 10 days, please call [phone] to schedule an appointment        Discharge Medications:     What To Do With Your Medications        START taking these medications        Add'l Info   naltrexone 50 MG tablet  Commonly known as: DEPADE  Take 1 tablet (50 mg) by mouth daily.   Quantity: 30 tablet  Refills: 0               Where to Get Your Medications        Please check with staff for printed prescription or if prescription was faxed to your pharmacy.    Bring a paper prescription for each of these medications  naltrexone 50 MG tablet           Hospital Problem List:  Patient Active Problem List   Diagnosis    Alcohol withdrawal, with unspecified complication (CMS-HCC)             Reason for Admission to the Hospital / History of Present Illness:   Alexandria Olson is a 30 year old female with a PMH significant for alcohol use disorder who presents for alcohol withdrawal     Symptoms of withdrawal started during the afternoon of 2/16. Her last drink was a bottle of tequila the morning of 2/16. Patient became concerned that she was having significant shaking of her arms bilaterally, chills, paranoia, and visual  hallucinations and presented to [facility]. She denies past episodes of withdrawal symptoms. Per chart review, she was given a total of 2mg  Ativan IV, Haldol, and Benadryl for hallucinations and agitation. She was noted to be more awake and alert and was discharged home though she feels that she was discharged too early. Since then, she feels her symptoms of hallucination and paranoia are worse. Endorses visual hallucinations with crawling objects but does not elaborate further. Denies auditory hallucinations and suicidal/homicidal ideation. Denies chest pain, shortness of breath, and history of seizures.     She notes an increase in her binge drinking since November 2021. Denies major life events or stressors at that time that set off increased drinking. Reports a history of depression symptoms but has never been on any pharmacotherapy. Identifies that she just likes the party scene as a cause for her increase drinking. Unable to tell me how much and how often she is drinking now, but reports drinking liquor. 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 Lives by herself in an apartment but has friends for  support. Interested in resources for quitting or cutting back alcohol use.       Hospital Course by Problem:       #Alcohol withdrawal c/b alcoholic hallucinosis.   Last drink 24 hours prior to admission. 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 She was given Valium in the ED after presenting with CIWA scores of 16, 19. Given additional loading dose of phenobarbital and CIWA scores were monitored per alcohol withdrawal protocol. Ativan was available for appropriate sedation and titrated to CIWA scale but was not needed to be given. She was continued on fluids, thiamine, folic acid, and multivitamin. Social work was consulted to assist with alcohol cessation and financial  resources.On day of discharge, patient was feeling well, tremors and hallucinations had resolved, and she was feeling close to her baseline.     #Asymptomatic bacteruria   Urinanalysis with signs of infection. However, due to patient's initial acute encephalopathy 2/2 alcohol withdrawal she was unable to voice symptoms. She was given dose of ceftriaxone but upon resolution of mental status, denied symptoms. She was not discharged with antibiotics    ---------------------------------------------------------------------------------------------------------------------    Additional Hospital Diagnoses ("rule out" or "suspected" diagnoses, etc.):  None       Consultations Obtained During This Hospitalization:  None    Principal Procedure During This Hospitalization:  CT Head W/O Contrast    Result Date: 09/04/2020  No acute intracranial abnormality.      Microbiology Cultures:  Microbiology Results (last 7 days)       Procedure Component Value - Date/Time    COVID-19, Flu A/B Panel (POC) [[id number]] Collected: 09/04/20 0853    Lab Status: Final result Specimen: Swab from Nasal-Pharyngeal Updated: 09/04/20 0947     Respiratory Virus Source SWAB     Comment: NASOPHARYNX        COVID-19 Result NOT DETECTED     Influenza A, PCR NOT DETECTED     Influenza B, PCR NOT DETECTED     Respiratory Virus Comment Reference Range: Not Detected     Comment: An interpretation of not detected cannot exclude the presence of specific   nucleic acid in concentrations below detection limits. The cobas Liat   SARS-CoV-2 and influenza A/B is a multiplexed real-time reverse transcription   polymerase chain reaction (RT-PCR) intended for the qualitative detection and   differentiation of SARS-CoV-2, influenza A, and influenza B viral RNA.  This test has not been validated for use in asymptomatic individuals. Testing   may have decreased sensitivity in asymptomatic individuals, so caution should   be exercised when interpreting not detected  results. Influenza A and influenza   B may be falsely negative in specimens that have a detected SARS-CoV-2 result.   Re-testing with an alternative influenza test is recommended if clinically   indicated.  This test has been authorized by the Food and Drug Administration (FDA) under an Emergency Use Authorization (EUA) for use at the point of care in patient care settings operating under a CLIA Certificate of Waiver.                       Hospital Events:  As Above      Tests Outstanding at Discharge Requiring Follow Up:  Culture of urine    Discharge Condition:  Improved.      Key Physical Exam Findings at Discharge:  No significant physical examination findings at the time of discharge.  09/04/20  1900 09/05/20  0005 09/05/20  0453 09/05/20  0830   BP: (!) 138/99 135/88 135/83 140/78   Pulse: 78 72 69 86   Resp: 16 18 18 18    Temp: 97.9 F (36.6 C) 97.9 F (36.6 C) 97.6 F (36.4 C) 98.1 F (36.7 C)   SpO2: 99% 98% 99% 99%     Gen: female resting comfortably in NAD  Neck: supple, no JVD, no LAD or masses   Resp: CTAB, NWOB, no stridor or wheeze, breathing on room air  Cardiac: RRR, No MRG on auscultation  Abdomen: soft, nondistended, non-tender, normoactive bowel sounds   Extremities: warm/well-perfused, no edema, 2+ pulses, no edema   Skin: no open lesiosn or ulcers   Neurological: A&Ox3, speech intact, conjugate gaze, equal pupillary diameter, moving all extremities smoothly, spontaneously, and without difficulty, no tremors, full symmetric sensation of distal extremities.  Psych: normal mood and affect, normal rate and cadence of speech, no longer responding to internal stimuli    Discharge Diet:   Diet Order Regular       Allergies:  No Known Allergies    MDRO Status: Negative    Discharge Code Status:  Full code / full care  This code status is not changed from the time of admission.    Advance Directive on File?  No    For appointments requested for after discharge that have not yet been scheduled,  refer to the Post Discharge Referrals section of the After Visit Summary.    Discharging Physician's Contact Information:   Discharging Physician's Contact Information: [facility] at [phone]      Thank you for allowing me to partake in your care during your hospital stay.  [phone], MD, PGY 1  Resident Physician - [facility]      09/05/20

## 2020-09-07 LAB — ECG 12-LEAD
P AXIS: 94 Deg
QRS INTERVAL/DURATION: 94 ms
QTC INTERVAL: 498 ms
